# Patient Record
Sex: Male | Born: 1955 | Race: White | Hispanic: No | Marital: Married | Smoking: Never smoker
Health system: Southern US, Community
[De-identification: ages and names within clinical notes are randomized; demographics above are authoritative.]

## PROBLEM LIST (undated history)

## (undated) DIAGNOSIS — N201 Calculus of ureter: Secondary | ICD-10-CM

## (undated) DIAGNOSIS — Z87442 Personal history of urinary calculi: Secondary | ICD-10-CM

## (undated) DIAGNOSIS — Z973 Presence of spectacles and contact lenses: Secondary | ICD-10-CM

## (undated) HISTORY — PX: EXTRACORPOREAL SHOCK WAVE LITHOTRIPSY: SHX1557

---

## 1999-03-25 ENCOUNTER — Encounter: Payer: Self-pay | Admitting: Family Medicine

## 1999-03-25 ENCOUNTER — Ambulatory Visit (HOSPITAL_COMMUNITY): Admission: RE | Admit: 1999-03-25 | Discharge: 1999-03-25 | Payer: Self-pay | Admitting: Family Medicine

## 1999-03-30 ENCOUNTER — Encounter: Payer: Self-pay | Admitting: Urology

## 1999-03-30 ENCOUNTER — Ambulatory Visit (HOSPITAL_COMMUNITY): Admission: RE | Admit: 1999-03-30 | Discharge: 1999-03-30 | Payer: Self-pay | Admitting: Urology

## 1999-05-08 ENCOUNTER — Encounter: Payer: Self-pay | Admitting: Urology

## 1999-05-08 ENCOUNTER — Ambulatory Visit (HOSPITAL_BASED_OUTPATIENT_CLINIC_OR_DEPARTMENT_OTHER): Admission: RE | Admit: 1999-05-08 | Discharge: 1999-05-08 | Payer: Self-pay | Admitting: Urology

## 1999-05-11 ENCOUNTER — Encounter: Payer: Self-pay | Admitting: Urology

## 1999-05-11 ENCOUNTER — Ambulatory Visit (HOSPITAL_COMMUNITY): Admission: RE | Admit: 1999-05-11 | Discharge: 1999-05-11 | Payer: Self-pay | Admitting: Urology

## 2003-12-10 ENCOUNTER — Ambulatory Visit (HOSPITAL_COMMUNITY): Admission: RE | Admit: 2003-12-10 | Discharge: 2003-12-10 | Payer: Self-pay | Admitting: Family Medicine

## 2005-08-11 ENCOUNTER — Emergency Department (HOSPITAL_COMMUNITY): Admission: EM | Admit: 2005-08-11 | Discharge: 2005-08-11 | Payer: Self-pay | Admitting: Emergency Medicine

## 2005-09-20 ENCOUNTER — Emergency Department (HOSPITAL_COMMUNITY): Admission: EM | Admit: 2005-09-20 | Discharge: 2005-09-20 | Payer: Self-pay | Admitting: Emergency Medicine

## 2005-09-22 ENCOUNTER — Ambulatory Visit (HOSPITAL_BASED_OUTPATIENT_CLINIC_OR_DEPARTMENT_OTHER): Admission: RE | Admit: 2005-09-22 | Discharge: 2005-09-22 | Payer: Self-pay | Admitting: Urology

## 2005-09-22 ENCOUNTER — Ambulatory Visit (HOSPITAL_COMMUNITY): Admission: RE | Admit: 2005-09-22 | Discharge: 2005-09-22 | Payer: Self-pay | Admitting: Urology

## 2005-11-11 ENCOUNTER — Ambulatory Visit (HOSPITAL_COMMUNITY): Admission: RE | Admit: 2005-11-11 | Discharge: 2005-11-11 | Payer: Self-pay | Admitting: Urology

## 2006-02-28 ENCOUNTER — Ambulatory Visit (HOSPITAL_COMMUNITY): Admission: RE | Admit: 2006-02-28 | Discharge: 2006-02-28 | Payer: Self-pay | Admitting: Urology

## 2012-08-16 ENCOUNTER — Ambulatory Visit
Admission: RE | Admit: 2012-08-16 | Discharge: 2012-08-16 | Disposition: A | Discharge status: Home / Self Care | Payer: BC Managed Care – PPO | Source: Ambulatory Visit | Attending: Family Medicine | Admitting: Family Medicine

## 2012-08-16 ENCOUNTER — Other Ambulatory Visit: Payer: Self-pay | Admitting: Family Medicine

## 2012-08-16 DIAGNOSIS — R1030 Lower abdominal pain, unspecified: Secondary | ICD-10-CM

## 2012-08-16 MED ORDER — IOHEXOL 300 MG/ML  SOLN
125.0000 mL | Freq: Once | INTRAMUSCULAR | Status: AC | PRN
  Administered 2012-08-16: 125 mL via INTRAVENOUS

## 2012-08-16 MED ORDER — IOHEXOL 300 MG/ML  SOLN
30.0000 mL | Freq: Once | INTRAMUSCULAR | Status: AC | PRN
  Administered 2012-08-16: 30 mL via ORAL

## 2012-08-17 ENCOUNTER — Encounter (HOSPITAL_COMMUNITY): Payer: Self-pay | Admitting: *Deleted

## 2012-08-17 ENCOUNTER — Other Ambulatory Visit: Payer: Self-pay | Admitting: Urology

## 2012-08-17 NOTE — Pre-Procedure Instructions (Signed)
Asked to bring blue folder the day of the procedure,insurance card,I.D. driver's license,wear comfortable clothing and have a driver for the day. Asked not to take Advil,Motrin,Ibuprofen,Aleve or any NSAIDS, Aspirin, or Toradol for 72 hours prior to procedure,  No vitamins or herbal medications 7 days prior to procedure. Instructed to take laxative per doctor's office instructions and eat a light dinner the evening before procedure.   To arrive at 0645 for lithotripsy procedure.  

## 2012-08-18 NOTE — H&P (Signed)
History of Present Illness   Thomas Stafford presents today as an urgent walk-in. He has been seen primarily in the past by Dr. Aldean Ast. He estimates 5-6 clinical stone events in his life. He has required lithotripsy and he believes that was fairly successful in the past. The patient developed some left-sided inguinal discomfort approximately 7-10 days ago. He was initially seen by his primary care physician and thought possibly to have a prostatitis and put on antibiotics. Because of ongoing discomfort as well as his history, the patient did have CT imaging done yesterday. This revealed approximately a 6 mm distal left ureteral calculus. In addition, the patient has quite a large 13 mm x 13 mm stone in his left renal pelvis. That stone is currently nonobstructing and the hydronephrosis he has appears to be secondary to the distal stone. The patient is currently on antibiotic therapy. He is continuing to have some discomfort. He tells me he has passed stones this large in the past. He has been appropriately started on tamsulosin to help facilitate stone passage.      Past Medical History Problems  1. History of  Abdominal Pain In The Left Lower Belly (LLQ) 789.04 2. History of  Hematuria 599.70 3. History of  Hypercholesterolemia 272.0 4. History of  Nephrolithiasis V13.01 5. History of  Ureteral Stone 592.1  Surgical History Problems  1. History of  Cystoscopy With Ureteroscopy 2. History of  Dental Surgery 3. History of  Lithotripsy  Current Meds 1. Lipitor TABS; Therapy: (Recorded:07Nov2013) to 2. TraMADol HCl 50 MG Oral Tablet; Therapy: 06Nov2013 to  Allergies Medication  1. No Known Drug Allergies  Family History Problems  1. Family history of  Family Health Status - Father's Age 75. Family history of  Family Health Status - Mother's Age 47 3. Son's history of  Family Health Status Number Of Children 1 son (8)  Social History Problems  1. Caffeine Use 3 per day 2. Marital  History - Currently Married 3. 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 History of  Alcohol Use 6. History of  Tobacco Use V15.82  Review of Systems Genitourinary, constitutional, skin, eye, otolaryngeal, hematologic/lymphatic, cardiovascular, pulmonary, endocrine, musculoskeletal, gastrointestinal, neurological and psychiatric system(s) were reviewed and pertinent findings if present are noted.  Genitourinary: urinary frequency, feelings of urinary urgency, nocturia, difficulty starting the urinary stream, weak urinary stream, urinary stream starts and stops, hematuria and erectile dysfunction.  Gastrointestinal: abdominal pain.    Vitals Vital Signs [Data Includes: Last 1 Day]  07Nov2013 09:41AM  BMI Calculated: 25.93 BSA Calculated: 2.18 Height: 6 ft 2 in Weight: 202 lb  Blood Pressure: 142 / 94 Temperature: 98.3 F Heart Rate: 76  Physical Exam Constitutional: Well nourished and well developed . No acute distress.  ENT:. The ears and nose are normal in appearance.  Neck: The appearance of the neck is normal and no neck mass is present.  Pulmonary: No respiratory distress and normal respiratory rhythm and effort.  Abdomen: The abdomen is soft and nontender. No masses are palpated. No CVA tenderness. No hernias are palpable. No hepatosplenomegaly noted.  Genitourinary: Examination of the penis demonstrates no discharge, no masses, no lesions and a normal meatus. The scrotum is without lesions. The right epididymis is palpably normal and non-tender. The left epididymis is palpably normal and non-tender. The right testis is non-tender and without masses. The left testis is non-tender and without masses.  Skin: Normal skin turgor, no visible rash and no visible skin  lesions.  Neuro/Psych:. Mood and affect are appropriate.    Results/Data Urine [Data Includes: Last 1 Day]   07Nov2013  COLOR YELLOW   APPEARANCE CLEAR   SPECIFIC GRAVITY 1.025   pH 6.0    GLUCOSE NEG mg/dL  BILIRUBIN NEG   KETONE NEG mg/dL  BLOOD LARGE   PROTEIN TRACE mg/dL  UROBILINOGEN 0.2 mg/dL  NITRITE NEG   LEUKOCYTE ESTERASE SMALL   SQUAMOUS EPITHELIAL/HPF RARE   WBC 11-20 WBC/hpf  RBC TNTC RBC/hpf  BACTERIA FEW   CRYSTALS NONE SEEN   CASTS NONE SEEN    Assessment Assessed  1. Nephrolithiasis Of The Left Kidney 592.0 2. Ureteral Stone 592.1  Plan Ureteral Stone (592.1)  1. Follow-up Schedule Surgery Office  Follow-up  Requested for: 07Nov2013  Discussion/Summary  A total of 35 minutes were spent in the overall care of the patient today with 25 minutes in direct face to face consultation.   Thomas Stafford has a somewhat complicated situation at this time. He has a 6 mm distal stone. I have reviewed his imaging studies and concur with the radiologist's assessment. This stone potentially has about a 50/50 chance of spontaneously passing. He has had pain and some discomfort now for 7-10 days and is not really interested in waiting a long period of time. He is most interested in lithotripsy. He is told that the success rate for a stone in that location is going to be in the 70-80% range. With ureteroscopy, it is going to be higher, like 98%, but it is a more invasive therapy. The other issue is really what to do with his left kidney stone. It does appear to be fairly dense based on Hounsfield units of 1200. My concern is that lithotripsy by itself would result in a high likelihood of obstructing fragments and potentially poor fragmentation. A percutaneous nephrolithotomy may be required. One option I discussed with his was ureteroscopy with Holmium laser of the distal stone with placement of a stent and then lithotripsy. He, however, is more interested in trying lithotripsy for the distal stone. Obviously, how he handles the larger stone in his kidney will depend on how well the lithotripsy works and what we deem the ultimate fragmentation likelihood. He will continue on  tamsulosin. I will add some hydrocodone to his pain regimen in case it is more uncomfortable for him.

## 2012-08-21 ENCOUNTER — Encounter (HOSPITAL_COMMUNITY): Payer: Self-pay

## 2012-08-21 ENCOUNTER — Ambulatory Visit (HOSPITAL_COMMUNITY): Payer: BC Managed Care – PPO

## 2012-08-21 ENCOUNTER — Ambulatory Visit (HOSPITAL_COMMUNITY)
Admission: RE | Admit: 2012-08-21 | Discharge: 2012-08-21 | Disposition: A | Discharge status: Home / Self Care | Payer: BC Managed Care – PPO | Source: Ambulatory Visit | Attending: Urology | Admitting: Urology

## 2012-08-21 ENCOUNTER — Encounter (HOSPITAL_COMMUNITY)
Admission: RE | Disposition: A | Discharge status: Home / Self Care | Payer: Self-pay | Source: Ambulatory Visit | Attending: Urology

## 2012-08-21 DIAGNOSIS — E7800 Pure hypercholesterolemia, unspecified: Secondary | ICD-10-CM | POA: Insufficient documentation

## 2012-08-21 DIAGNOSIS — N201 Calculus of ureter: Secondary | ICD-10-CM | POA: Insufficient documentation

## 2012-08-21 DIAGNOSIS — Z79899 Other long term (current) drug therapy: Secondary | ICD-10-CM | POA: Insufficient documentation

## 2012-08-21 SURGERY — LITHOTRIPSY, ESWL
Anesthesia: LOCAL | Laterality: Left

## 2012-08-21 MED ORDER — CIPROFLOXACIN IN D5W 400 MG/200ML IV SOLN
400.0000 mg | INTRAVENOUS | Status: AC
  Administered 2012-08-21: 400 mg via INTRAVENOUS
  Filled 2012-08-21: qty 200

## 2012-08-21 MED ORDER — DIPHENHYDRAMINE HCL 25 MG PO CAPS
25.0000 mg | ORAL_CAPSULE | ORAL | Status: AC
  Administered 2012-08-21: 25 mg via ORAL
  Filled 2012-08-21: qty 1

## 2012-08-21 MED ORDER — DEXTROSE-NACL 5-0.45 % IV SOLN
INTRAVENOUS | Status: DC
  Administered 2012-08-21: 08:00:00 via INTRAVENOUS

## 2012-08-21 MED ORDER — DIAZEPAM 5 MG PO TABS
10.0000 mg | ORAL_TABLET | ORAL | Status: AC
  Administered 2012-08-21: 10 mg via ORAL
  Filled 2012-08-21: qty 2

## 2012-08-21 NOTE — Op Note (Signed)
See [facility] OP note scanned into chart. 

## 2012-08-21 NOTE — Interval H&P Note (Signed)
History and Physical Interval Note:  08/21/2012 9:21 AM  Thomas Stafford  has presented today for surgery, with the diagnosis of LEFT URETERAL CALCULI  The various methods of treatment have been discussed with the patient and family. After consideration of risks, benefits and other options for treatment, the patient has consented to  Procedure(s) (LRB) with comments: EXTRACORPOREAL SHOCK WAVE LITHOTRIPSY (ESWL) (Left) as a surgical intervention .  The patient's history has been reviewed, patient examined, no change in status, stable for surgery.  I have reviewed the patient's chart and labs.  Questions were answered to the patient's satisfaction.     [name]

## 2012-09-13 ENCOUNTER — Other Ambulatory Visit: Payer: Self-pay | Admitting: Urology

## 2012-09-14 ENCOUNTER — Ambulatory Visit (HOSPITAL_BASED_OUTPATIENT_CLINIC_OR_DEPARTMENT_OTHER)
Admission: RE | Admit: 2012-09-14 | Discharge: 2012-09-14 | Disposition: A | Discharge status: Home / Self Care | Payer: BC Managed Care – PPO | Source: Ambulatory Visit | Attending: Urology | Admitting: Urology

## 2012-09-14 ENCOUNTER — Other Ambulatory Visit: Payer: Self-pay | Admitting: Urology

## 2012-09-14 ENCOUNTER — Encounter (HOSPITAL_BASED_OUTPATIENT_CLINIC_OR_DEPARTMENT_OTHER): Payer: Self-pay | Admitting: Anesthesiology

## 2012-09-14 ENCOUNTER — Ambulatory Visit (HOSPITAL_BASED_OUTPATIENT_CLINIC_OR_DEPARTMENT_OTHER): Payer: BC Managed Care – PPO | Admitting: Anesthesiology

## 2012-09-14 ENCOUNTER — Encounter (HOSPITAL_BASED_OUTPATIENT_CLINIC_OR_DEPARTMENT_OTHER)
Admission: RE | Disposition: A | Discharge status: Home / Self Care | Payer: Self-pay | Source: Ambulatory Visit | Attending: Urology

## 2012-09-14 ENCOUNTER — Encounter (HOSPITAL_BASED_OUTPATIENT_CLINIC_OR_DEPARTMENT_OTHER): Payer: Self-pay | Admitting: *Deleted

## 2012-09-14 DIAGNOSIS — N200 Calculus of kidney: Secondary | ICD-10-CM | POA: Insufficient documentation

## 2012-09-14 DIAGNOSIS — R109 Unspecified abdominal pain: Secondary | ICD-10-CM | POA: Insufficient documentation

## 2012-09-14 HISTORY — DX: Calculus of ureter: N20.1

## 2012-09-14 HISTORY — PX: CYSTOSCOPY W/ URETERAL STENT PLACEMENT: SHX1429

## 2012-09-14 LAB — POCT HEMOGLOBIN-HEMACUE: Hemoglobin: 16.3 g/dL (ref 13.0–17.0)

## 2012-09-14 SURGERY — CYSTOSCOPY, WITH RETROGRADE PYELOGRAM AND URETERAL STENT INSERTION
Anesthesia: General | Site: Ureter | Laterality: Left | Wound class: Clean Contaminated

## 2012-09-14 MED ORDER — IOHEXOL 350 MG/ML SOLN
INTRAVENOUS | Status: DC | PRN
  Administered 2012-09-14: 4 mL

## 2012-09-14 MED ORDER — STERILE WATER FOR IRRIGATION IR SOLN
Status: DC | PRN
  Administered 2012-09-14: 3000 mL

## 2012-09-14 MED ORDER — PROPOFOL 10 MG/ML IV BOLUS
INTRAVENOUS | Status: DC | PRN
  Administered 2012-09-14: 200 mg via INTRAVENOUS

## 2012-09-14 MED ORDER — DIAZEPAM 5 MG PO TABS
10.0000 mg | ORAL_TABLET | ORAL | Status: DC
  Filled 2012-09-14: qty 2

## 2012-09-14 MED ORDER — TAMSULOSIN HCL 0.4 MG PO CAPS
0.4000 mg | ORAL_CAPSULE | Freq: Once | ORAL | Status: AC
  Administered 2012-09-14: 0.4 mg via ORAL
  Filled 2012-09-14: qty 1

## 2012-09-14 MED ORDER — LIDOCAINE HCL (CARDIAC) 20 MG/ML IV SOLN
INTRAVENOUS | Status: DC | PRN
  Administered 2012-09-14: 60 mg via INTRAVENOUS

## 2012-09-14 MED ORDER — ONDANSETRON HCL 4 MG/2ML IJ SOLN
INTRAMUSCULAR | Status: DC | PRN
  Administered 2012-09-14: 4 mg via INTRAVENOUS

## 2012-09-14 MED ORDER — MIDAZOLAM HCL 5 MG/5ML IJ SOLN
INTRAMUSCULAR | Status: DC | PRN
  Administered 2012-09-14: 2 mg via INTRAVENOUS

## 2012-09-14 MED ORDER — BELLADONNA ALKALOIDS-OPIUM 16.2-60 MG RE SUPP
1.0000 | Freq: Once | RECTAL | Status: AC
  Administered 2012-09-14: 1 via RECTAL
  Filled 2012-09-14: qty 1

## 2012-09-14 MED ORDER — LACTATED RINGERS IV SOLN
INTRAVENOUS | Status: DC
  Administered 2012-09-14 (×2): via INTRAVENOUS
  Filled 2012-09-14: qty 1000

## 2012-09-14 MED ORDER — LACTATED RINGERS IV SOLN
INTRAVENOUS | Status: DC
  Filled 2012-09-14: qty 1000

## 2012-09-14 MED ORDER — CIPROFLOXACIN IN D5W 400 MG/200ML IV SOLN
400.0000 mg | INTRAVENOUS | Status: DC
  Filled 2012-09-14: qty 200

## 2012-09-14 MED ORDER — LIDOCAINE HCL 2 % EX GEL
CUTANEOUS | Status: DC | PRN
  Administered 2012-09-14: 1 via URETHRAL

## 2012-09-14 MED ORDER — FENTANYL CITRATE 0.05 MG/ML IJ SOLN
25.0000 ug | INTRAMUSCULAR | Status: DC | PRN
  Filled 2012-09-14: qty 1

## 2012-09-14 MED ORDER — DEXAMETHASONE SODIUM PHOSPHATE 4 MG/ML IJ SOLN
INTRAMUSCULAR | Status: DC | PRN
  Administered 2012-09-14: 8 mg via INTRAVENOUS

## 2012-09-14 MED ORDER — FENTANYL CITRATE 0.05 MG/ML IJ SOLN
INTRAMUSCULAR | Status: DC | PRN
  Administered 2012-09-14 (×2): 50 ug via INTRAVENOUS

## 2012-09-14 MED ORDER — DEXTROSE-NACL 5-0.45 % IV SOLN
INTRAVENOUS | Status: DC
  Filled 2012-09-14: qty 1000

## 2012-09-14 MED ORDER — DIPHENHYDRAMINE HCL 25 MG PO CAPS
25.0000 mg | ORAL_CAPSULE | ORAL | Status: DC
  Filled 2012-09-14: qty 1

## 2012-09-14 SURGICAL SUPPLY — 19 items
BAG DRAIN URO-CYSTO SKYTR STRL (DRAIN) ×2 IMPLANT
CANISTER SUCT LVC 12 LTR MEDI- (MISCELLANEOUS) ×2 IMPLANT
CATH INTERMIT  6FR 70CM (CATHETERS) IMPLANT
CATH URET 5FR 28IN CONE TIP (BALLOONS)
CATH URET 5FR 70CM CONE TIP (BALLOONS) IMPLANT
CLOTH BEACON ORANGE TIMEOUT ST (SAFETY) ×2 IMPLANT
DRAPE CAMERA CLOSED 9X96 (DRAPES) ×2 IMPLANT
GLOVE BIO SURGEON STRL SZ7 (GLOVE) ×2 IMPLANT
GLOVE BIOGEL PI IND STRL 6.5 (GLOVE) ×1 IMPLANT
GLOVE BIOGEL PI IND STRL 7.0 (GLOVE) ×1 IMPLANT
GLOVE BIOGEL PI INDICATOR 6.5 (GLOVE) ×1
GLOVE BIOGEL PI INDICATOR 7.0 (GLOVE) ×1
GOWN STRL NON-REIN LRG LVL3 (GOWN DISPOSABLE) ×4 IMPLANT
GUIDEWIRE 0.038 PTFE COATED (WIRE) IMPLANT
GUIDEWIRE ANG ZIPWIRE 038X150 (WIRE) IMPLANT
GUIDEWIRE STR DUAL SENSOR (WIRE) IMPLANT
NS IRRIG 500ML POUR BTL (IV SOLUTION) IMPLANT
PACK CYSTOSCOPY (CUSTOM PROCEDURE TRAY) ×2 IMPLANT
STENT URET 6FRX26 CONTOUR (STENTS) ×2 IMPLANT

## 2012-09-14 NOTE — Progress Notes (Signed)
Pt requested stronger pain med.  Dr. Brunilda Payor paged and informed.  Wife sent to [facility] to pick up new script.

## 2012-09-14 NOTE — Op Note (Signed)
   Thomas Stafford 09/14/2012, 3:10 PM      Thomas Stafford is a 56 y.o.   09/14/2012  General  Pre-op diagnosis: Left renal pelvis calculus. Severe left flank pain  Postop diagnosis: Same  Procedure done: Cystoscopy, left retrograde pyelogram, insertion of double-J stent  Surgeon: Wendie Simmer. Selenia Mihok  Anesthesia: General  Indication: Patient is a 56 years old male who had ESL of a left and distal ureteral calculus about 2 weeks ago. He passed several small stone fragments. KUB 2 days ago showed no remaining stone in the distal ureter. He also is known to have a 10 mm stone in the left kidney. He was doing well until yesterday afternoon when he experienced severe left flank pain associated with nausea and vomiting. The pain persisted throughout the night. He was seen in the office this morning. Repeat KUB shows that the stone has migrated into the proximal ureter. He is already scheduled for ESL of the left renal calculus. He is scheduled now for cystoscopy, left retrograde pyelogram and insertion of double-J stent.  Procedure: Patient was identified by his wrist band and proper timeout was taken.  Under general anesthesia patient was prepped and draped and placed in the dorsolithotomy position. A panendoscope was inserted in the bladder. The anterior urethra is normal. There is moderate prostatic hypertrophy. There is no stone or tumor in the bladder. The ureteral orifices are in normal position and shape.  Retrograde pyelogram:  A cone-tip catheter was passed through the cystoscope and the left ureteral orifice. Contrast was then injected through the cone-tip catheter. The distal and mid ureter appear normal. There is a large filling defect at the UPJ. I did not inject the contrast under pressure and the collecting system was not clearly visualized. The cone-tip catheter was then removed.  A sensor wire was passed through the cystoscope and the left ureter. A #[id number] double-J stent was  passed over the sensor wire. The sensor wire was then removed. The proximal curl of the double-J stent is in the collecting system and the distal curl is in the bladder. The stent was left without a string.  The bladder was then emptied and the cystoscope removed.  Patient tolerated the procedure well and left the OR in satisfactory condition to postanesthesia care unit. The from

## 2012-09-14 NOTE — Anesthesia Postprocedure Evaluation (Signed)
  Anesthesia Post-op Note  Patient: Thomas Stafford  Procedure(s) Performed: Procedure(s) (LRB): CYSTOSCOPY WITH RETROGRADE PYELOGRAM/URETERAL STENT PLACEMENT (Left)  Patient Location: PACU  Anesthesia Type: General  Level of Consciousness: awake and alert   Airway and Oxygen Therapy: Patient Spontanous Breathing  Post-op Pain: mild  Post-op Assessment: Post-op Vital signs reviewed, Patient's Cardiovascular Status Stable, Respiratory Function Stable, Patent Airway and No signs of Nausea or vomiting  Last Vitals:  Filed Vitals:   09/14/12 1517  BP: 130/85  Pulse: 75  Temp: 36.2 C  Resp: 14    Post-op Vital Signs: stable   Complications: No apparent anesthesia complications

## 2012-09-14 NOTE — H&P (Signed)
History of Present Illness  [name] is a patient of Thomas Stafford who had ESL of a left distal ureteral calculus on 11/11.  He passed some small stone fragments and was doing well until yesterday afternoon at about 5 PM when he experienced severe left flank and left lower quadrant pain associated with nausea and vomiting.  The pain persisted throught the night.  He is known to have a 10 mm left renal pelvis stone and is scheduled for ESL on 12/16.  KUB today shows that the stone may have migrated to the UPJ.  There is no opaque stone in the proximal, mid or distal ureter.   Past Medical History Problems  1. History of  Abdominal Pain In The Left Lower Belly (LLQ) 789.04 2. History of  Hematuria 599.70 3. History of  Hypercholesterolemia 272.0 4. History of  Nephrolithiasis V13.01 5. History of  Ureteral Stone 592.1  Surgical History Problems  1. History of  Cystoscopy With Ureteroscopy 2. History of  Dental Surgery 3. History of  Lithotripsy  Current Meds 1. Hydrocodone-Acetaminophen 5-500 MG Oral Tablet; 1-2 TABS PO Q 4-6H PRN PAIN; Therapy:  07Nov2013 to (Last Rx:07Nov2013) 2. Lipitor TABS; Therapy: (Recorded:07Nov2013) to 3. TraMADol HCl 50 MG Oral Tablet; Therapy: 06Nov2013 to  Allergies Medication  1. No Known Drug Allergies  Family History Problems  1. Family history of  Family Health Status - Father's Age 52. Family history of  Family Health Status - Mother's Age 42 3. Son's history of  Family Health Status Number Of Children 1 son (8)  Social History Problems  1. Caffeine Use 3 per day 2. Marital History - Currently Married 3. 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 History of  Alcohol Use 6. History of  Tobacco Use V15.82  Review of Systems Genitourinary, constitutional, skin, eye, otolaryngeal, hematologic/lymphatic, cardiovascular, pulmonary, endocrine, musculoskeletal, gastrointestinal, neurological and psychiatric system(s)  were reviewed and pertinent findings if present are noted.  Gastrointestinal: nausea, vomiting and abdominal pain.    Vitals Vital Signs [Data Includes: Last 1 Day]  05Dec2013 10:54AM  Blood Pressure: 135 / 91 Temperature: 97.5 F Heart Rate: 82 Respiration: 18  Physical Exam Constitutional: Well nourished and well developed . No acute distress.  ENT:. The ears and nose are normal in appearance.  Neck: The appearance of the neck is normal and no neck mass is present.  Pulmonary: No respiratory distress and normal respiratory rhythm and effort.  Cardiovascular: Heart rate and rhythm are normal . No peripheral edema.  Abdomen: The abdomen is soft and nontender. No masses are palpated. No CVA tenderness. No hernias are palpable. No hepatosplenomegaly noted.  Genitourinary: Examination of the penis demonstrates no discharge, no masses, no lesions and a normal meatus. The scrotum is without lesions. The right epididymis is palpably normal and non-tender. The left epididymis is palpably normal and non-tender. The right testis is non-tender and without masses. The left testis is non-tender and without masses.  Lymphatics: The femoral and inguinal nodes are not enlarged or tender.  Skin: Normal skin turgor, no visible rash and no visible skin lesions.  Neuro/Psych:. Mood and affect are appropriate.    Results/Data Urine [Data Includes: Last 1 Day]   05Dec2013  COLOR YELLOW   APPEARANCE CLEAR   SPECIFIC GRAVITY 1.020   pH 7.0   GLUCOSE NEG mg/dL  BILIRUBIN NEG   KETONE NEG mg/dL  BLOOD LARGE   PROTEIN TRACE mg/dL  UROBILINOGEN 0.2 mg/dL  NITRITE NEG  LEUKOCYTE ESTERASE NEG   SQUAMOUS EPITHELIAL/HPF RARE   WBC 0-2 WBC/hpf  RBC TNTC RBC/hpf  BACTERIA FEW   CRYSTALS NONE SEEN   CASTS NONE SEEN   Other SEE NOTE     KUB INDICATION: Left flank pain.  Left renal calculus KUB shows normal bowel gas pattern.  Psoas shadows are normal.  There are several calcifications in the lower pole  of the left kidney.  A 10 mm calcification appears to have migrated from the mid/upper pole of the left kidney to the UPJ/proximal ureter.  There are also some smaller stones adjacent to the larger calculus.  I do not see any opaque stones in the mid/distal ureter.  There are no opaque calcifications in the right kidney nor in the course of the right ureter. IMPRESSION: Left UPJ calculus.  Left renal calculi.   Assessment Assessed  1. Nephrolithiasis Of The Left Kidney 592.0 2. Proximal Ureteral Stone On The Left 592.1  Plan Health Maintenance (V70.0)  1. UA With REFLEX  Done: 05Dec2013 10:42AM Nephrolithiasis Of The Left Kidney (592.0)  2. KUB  Done: 05Dec2013 12:00AM   His pain is probably secondary to the UPJ stone.  He would benefit from stent placement.  He will follow-up with Thomas Stafford for ESL as planned.   Signatures Electronically signed by : Thomas Stafford, M.D.; Sep 14 2012  1:20PM

## 2012-09-14 NOTE — Anesthesia Procedure Notes (Signed)
Procedure Name: LMA Insertion Date/Time: 09/14/2012 2:52 PM Performed by: Renella Cunas D Pre-anesthesia Checklist: Patient identified, Emergency Drugs available, Suction available and Patient being monitored Patient Re-evaluated:Patient Re-evaluated prior to inductionOxygen Delivery Method: Circle System Utilized Preoxygenation: Pre-oxygenation with 100% oxygen Intubation Type: IV induction Ventilation: Mask ventilation without difficulty LMA: LMA inserted LMA Size: 4.0 Number of attempts: 1 Airway Equipment and Method: bite block Placement Confirmation: positive ETCO2 Tube secured with: Tape Dental Injury: Teeth and Oropharynx as per pre-operative assessment

## 2012-09-14 NOTE — Transfer of Care (Signed)
Immediate Anesthesia Transfer of Care Note  Patient: Thomas Stafford  Procedure(s) Performed: Procedure(s) (LRB): CYSTOSCOPY WITH RETROGRADE PYELOGRAM/URETERAL STENT PLACEMENT (Left)  Patient Location: PACU  Anesthesia Type: General  Level of Consciousness: awake, oriented, sedated and patient cooperative  Airway & Oxygen Therapy: Patient Spontanous Breathing and Patient connected to face mask oxygen  Post-op Assessment: Report given to PACU RN and Post -op Vital signs reviewed and stable  Post vital signs: Reviewed and stable  Complications: No apparent anesthesia complications

## 2012-09-14 NOTE — Progress Notes (Signed)
Spoke w/ D. Myrtie Soman, NP.  Informed of void x2 30cc's,75cc's in that order. Pt had to strain to void the 2nd attempt. Bladder scanned , only 41cc's noted.  abd soft. Pt expressed desire to go home.  Ok for d/c to home.  Pt instructed if he is unable to void, to go to nearest E.R.  Pt verbalized his understanding.  See orders.

## 2012-09-14 NOTE — Progress Notes (Signed)
Still waiting for return call from Dr. On call.  Cherlynn Polo, NP paged via answering service.

## 2012-09-14 NOTE — Anesthesia Preprocedure Evaluation (Signed)

## 2012-09-15 ENCOUNTER — Encounter (HOSPITAL_BASED_OUTPATIENT_CLINIC_OR_DEPARTMENT_OTHER): Payer: Self-pay | Admitting: Urology

## 2012-09-15 NOTE — Pre-Procedure Instructions (Signed)
Asked to bring blue folder the day of the procedure,insurance card,I.D. driver's license,wear comfortable clothing and have a driver for the day. Asked not to take Advil,Motrin,Ibuprofen,Aleve or any NSAIDS, Aspirin, or Toradol for 72 hours prior to procedure,  No vitamins or herbal medications 7 days prior to procedure. Instructed to take laxative per doctor's office instructions and eat a light dinner the evening before procedure.   To arrive at 1000 for lithotripsy procedure.

## 2012-09-20 ENCOUNTER — Encounter (HOSPITAL_COMMUNITY): Payer: Self-pay | Admitting: Pharmacy Technician

## 2012-09-22 NOTE — Progress Notes (Signed)
Patient is posted for ESWL on 09/25/12; however, orders entered as if pt is having surgery. Message left with Webb Silversmith, scheduler at [facility] to clarify orders for Dr Isabel Caprice.

## 2012-09-25 ENCOUNTER — Ambulatory Visit (HOSPITAL_COMMUNITY): Payer: BC Managed Care – PPO

## 2012-09-25 ENCOUNTER — Encounter (HOSPITAL_COMMUNITY): Payer: Self-pay | Admitting: *Deleted

## 2012-09-25 ENCOUNTER — Ambulatory Visit (HOSPITAL_COMMUNITY)
Admission: RE | Admit: 2012-09-25 | Discharge: 2012-09-25 | Disposition: A | Discharge status: Home / Self Care | Payer: BC Managed Care – PPO | Source: Ambulatory Visit | Attending: Urology | Admitting: Urology

## 2012-09-25 ENCOUNTER — Encounter (HOSPITAL_COMMUNITY)
Admission: RE | Disposition: A | Discharge status: Home / Self Care | Payer: Self-pay | Source: Ambulatory Visit | Attending: Urology

## 2012-09-25 DIAGNOSIS — N201 Calculus of ureter: Secondary | ICD-10-CM | POA: Insufficient documentation

## 2012-09-25 DIAGNOSIS — N200 Calculus of kidney: Secondary | ICD-10-CM | POA: Insufficient documentation

## 2012-09-25 DIAGNOSIS — E7800 Pure hypercholesterolemia, unspecified: Secondary | ICD-10-CM | POA: Insufficient documentation

## 2012-09-25 DIAGNOSIS — R319 Hematuria, unspecified: Secondary | ICD-10-CM | POA: Insufficient documentation

## 2012-09-25 DIAGNOSIS — Z79899 Other long term (current) drug therapy: Secondary | ICD-10-CM | POA: Insufficient documentation

## 2012-09-25 SURGERY — LITHOTRIPSY, ESWL
Anesthesia: LOCAL | Laterality: Left

## 2012-09-25 MED ORDER — DIAZEPAM 5 MG PO TABS
10.0000 mg | ORAL_TABLET | Freq: Once | ORAL | Status: AC
  Administered 2012-09-25: 10 mg via ORAL
  Filled 2012-09-25: qty 2

## 2012-09-25 MED ORDER — DIPHENHYDRAMINE HCL 25 MG PO CAPS
25.0000 mg | ORAL_CAPSULE | Freq: Once | ORAL | Status: AC
  Administered 2012-09-25: 25 mg via ORAL
  Filled 2012-09-25 (×2): qty 1

## 2012-09-25 MED ORDER — DEXTROSE-NACL 5-0.45 % IV SOLN
INTRAVENOUS | Status: DC
  Administered 2012-09-25: 11:00:00 via INTRAVENOUS

## 2012-09-25 MED ORDER — DIPHENHYDRAMINE HCL 25 MG PO TABS: 25.0000 mg | ORAL_TABLET | Freq: Once | ORAL | Status: DC

## 2012-09-25 MED ORDER — CIPROFLOXACIN HCL 500 MG PO TABS
500.0000 mg | ORAL_TABLET | Freq: Once | ORAL | Status: AC
  Administered 2012-09-25: 500 mg via ORAL
  Filled 2012-09-25: qty 1

## 2012-09-25 NOTE — H&P (View-Only) (Signed)
History of Present Illness  Thomas Stafford is a patient of Dr Grapey's who had ESL of a left distal ureteral calculus on 11/11.  He passed some small stone fragments and was doing well until yesterday afternoon at about 5 PM when he experienced severe left flank and left lower quadrant pain associated with nausea and vomiting.  The pain persisted throught the night.  He is known to have a 10 mm left renal pelvis stone and is scheduled for ESL on 12/16.  KUB today shows that the stone may have migrated to the UPJ.  There is no opaque stone in the proximal, mid or distal ureter.   Past Medical History Problems  1. History of  Abdominal Pain In The Left Lower Belly (LLQ) 789.04 2. History of  Hematuria 599.70 3. History of  Hypercholesterolemia 272.0 4. History of  Nephrolithiasis V13.01 5. History of  Ureteral Stone 592.1  Surgical History Problems  1. History of  Cystoscopy With Ureteroscopy 2. History of  Dental Surgery 3. History of  Lithotripsy  Current Meds 1. Hydrocodone-Acetaminophen 5-500 MG Oral Tablet; 1-2 TABS PO Q 4-6H PRN PAIN; Therapy:  07Nov2013 to (Last Rx:07Nov2013) 2. Lipitor TABS; Therapy: (Recorded:07Nov2013) to 3. TraMADol HCl 50 MG Oral Tablet; Therapy: 06Nov2013 to  Allergies Medication  1. No Known Drug Allergies  Family History Problems  1. Family history of  Family Health Status - Father's Age 2. Family history of  Family Health Status - Mother's Age 76 3. Son's history of  Family Health Status Number Of Children 1 son (8)  Social History Problems  1. Caffeine Use 3 per day 2. Marital History - Currently Married 3. 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 History of  Alcohol Use 6. History of  Tobacco Use V15.82  Review of Systems Genitourinary, constitutional, skin, eye, otolaryngeal, hematologic/lymphatic, cardiovascular, pulmonary, endocrine, musculoskeletal, gastrointestinal, neurological and psychiatric system(s)  were reviewed and pertinent findings if present are noted.  Gastrointestinal: nausea, vomiting and abdominal pain.    Vitals Vital Signs [Data Includes: Last 1 Day]  05Dec2013 10:54AM  Blood Pressure: 135 / 91 Temperature: 97.5 F Heart Rate: 82 Respiration: 18  Physical Exam Constitutional: Well nourished and well developed . No acute distress.  ENT:. The ears and nose are normal in appearance.  Neck: The appearance of the neck is normal and no neck mass is present.  Pulmonary: No respiratory distress and normal respiratory rhythm and effort.  Cardiovascular: Heart rate and rhythm are normal . No peripheral edema.  Abdomen: The abdomen is soft and nontender. No masses are palpated. No CVA tenderness. No hernias are palpable. No hepatosplenomegaly noted.  Genitourinary: Examination of the penis demonstrates no discharge, no masses, no lesions and a normal meatus. The scrotum is without lesions. The right epididymis is palpably normal and non-tender. The left epididymis is palpably normal and non-tender. The right testis is non-tender and without masses. The left testis is non-tender and without masses.  Lymphatics: The femoral and inguinal nodes are not enlarged or tender.  Skin: Normal skin turgor, no visible rash and no visible skin lesions.  Neuro/Psych:. Mood and affect are appropriate.    Results/Data Urine [Data Includes: Last 1 Day]   05Dec2013  COLOR YELLOW   APPEARANCE CLEAR   SPECIFIC GRAVITY 1.020   pH 7.0   GLUCOSE NEG mg/dL  BILIRUBIN NEG   KETONE NEG mg/dL  BLOOD LARGE   PROTEIN TRACE mg/dL  UROBILINOGEN 0.2 mg/dL  NITRITE NEG     LEUKOCYTE ESTERASE NEG   SQUAMOUS EPITHELIAL/HPF RARE   WBC 0-2 WBC/hpf  RBC TNTC RBC/hpf  BACTERIA FEW   CRYSTALS NONE SEEN   CASTS NONE SEEN   Other SEE NOTE     KUB INDICATION: Left flank pain.  Left renal calculus KUB shows normal bowel gas pattern.  Psoas shadows are normal.  There are several calcifications in the lower pole  of the left kidney.  A 10 mm calcification appears to have migrated from the mid/upper pole of the left kidney to the UPJ/proximal ureter.  There are also some smaller stones adjacent to the larger calculus.  I do not see any opaque stones in the mid/distal ureter.  There are no opaque calcifications in the right kidney nor in the course of the right ureter. IMPRESSION: Left UPJ calculus.  Left renal calculi.   Assessment Assessed  1. Nephrolithiasis Of The Left Kidney 592.0 2. Proximal Ureteral Stone On The Left 592.1  Plan Health Maintenance (V70.0)  1. UA With REFLEX  Done: 05Dec2013 10:42AM Nephrolithiasis Of The Left Kidney (592.0)  2. KUB  Done: 05Dec2013 12:00AM   His pain is probably secondary to the UPJ stone.  He would benefit from stent placement.  He will follow-up with Dr Grapey for ESL as planned.   Signatures Electronically signed by : Jamaris Theard, M.D.; Sep 14 2012  1:20PM   

## 2012-09-25 NOTE — Interval H&P Note (Signed)
History and Physical Interval Note:  09/25/2012 10:42 AM  Thomas Stafford  has presented today for surgery, with the diagnosis of Left Renal Calculus  The various methods of treatment have been discussed with the patient and family. After consideration of risks, benefits and other options for treatment, the patient has consented to  Procedure(s) (LRB) with comments: EXTRACORPOREAL SHOCK WAVE LITHOTRIPSY (ESWL) (Left) as a surgical intervention .  The patient's history has been reviewed, patient examined, no change in status, stable for surgery.  I have reviewed the patient's chart and labs.  Questions were answered to the patient's satisfaction.     [name] S  Patient now for definitive ESWL for left renal pelvis stone s/p JJ stent placement by Dr. Su Grand

## 2012-09-25 NOTE — Op Note (Signed)
See [facility] OP note scanned into chart. 

## 2013-09-01 IMAGING — CR DG ABDOMEN 1V
1 series · 1 of 1 positions shown · non-contrast
Comparison: 09/14/2012.

CLINICAL DATA: Pre lithotripsy.  Left-sided kidney stone.

ABDOMEN - 1 VIEW

[t abdomen supine]
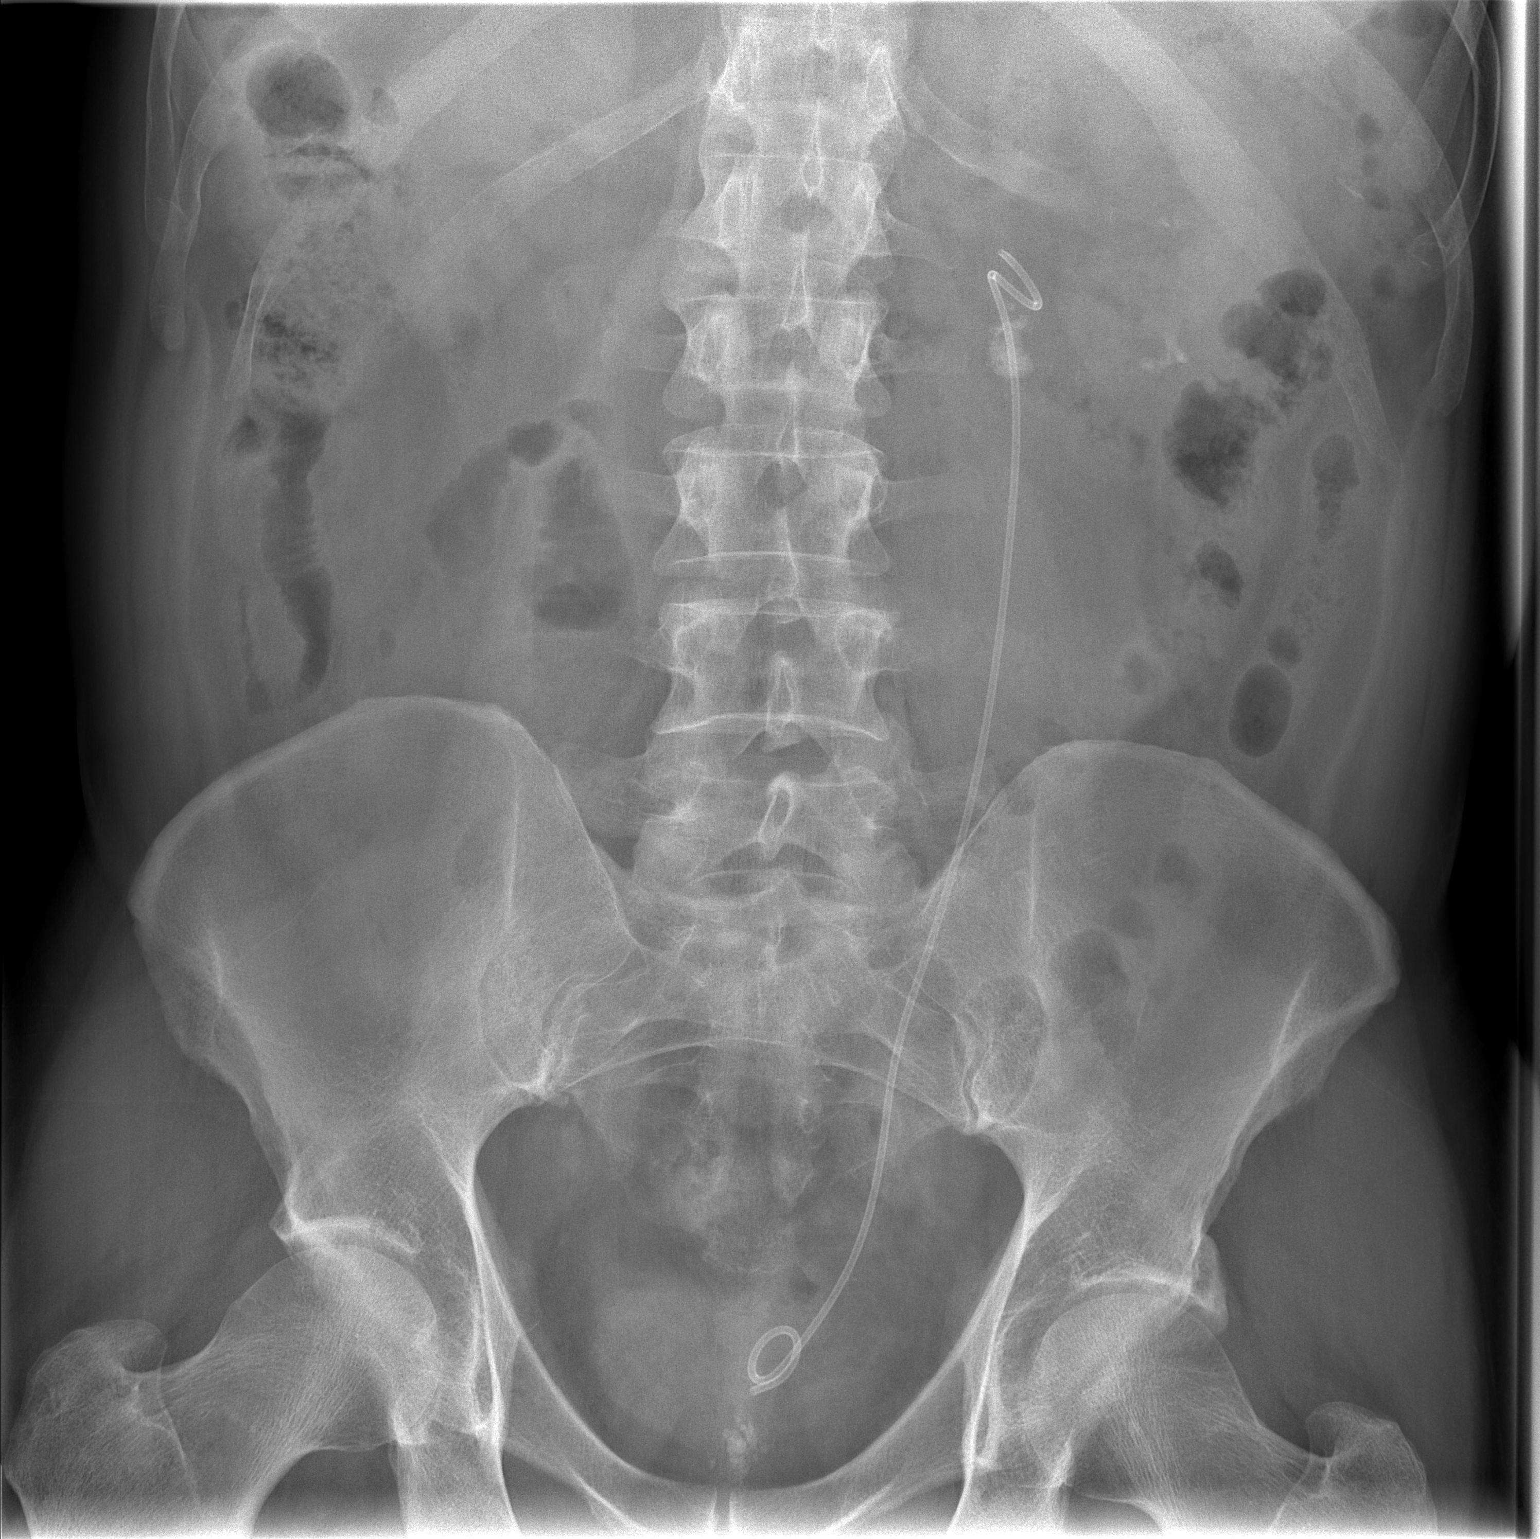

[1 of 1 positions shown; findings below may reference images not displayed]

FINDINGS: Compared to the prior examination there has been interval
placement of a left-sided double J ureteral stents, with
approximately reformed in the expected location of the left renal
pelvis and the distal loop performed in the expected location of
the urinary bladder.  Again noted are multiple calculi projecting
over the left renal collecting system, with the largest calculus
measuring approximately 18 mm projecting adjacent to the proximal
aspect of the left stent.  Calcifications in the low pelvis are
again noted, presumably prostatic.
IMPRESSION: 1.  Multiple left-sided calculi, as above, largest of which
measures up to 18 mm in diameter projecting adjacent to the
proximal aspect of the left double-J ureteral stent.

## 2013-12-07 ENCOUNTER — Encounter (HOSPITAL_BASED_OUTPATIENT_CLINIC_OR_DEPARTMENT_OTHER): Payer: Self-pay | Admitting: *Deleted

## 2013-12-07 ENCOUNTER — Other Ambulatory Visit: Payer: Self-pay | Admitting: Urology

## 2013-12-07 NOTE — Progress Notes (Signed)
NPO AFTER MN. ARRIVE AT 0800. NEEDS HG. MAY TAKE HYDROCODONE IF NEEDED W/ SIPS OF WATER.

## 2013-12-10 ENCOUNTER — Ambulatory Visit (HOSPITAL_BASED_OUTPATIENT_CLINIC_OR_DEPARTMENT_OTHER): Admission: RE | Admit: 2013-12-10 | Payer: BC Managed Care – PPO | Source: Ambulatory Visit | Admitting: Urology

## 2013-12-10 HISTORY — DX: Presence of spectacles and contact lenses: Z97.3

## 2013-12-10 HISTORY — DX: Personal history of urinary calculi: Z87.442

## 2013-12-10 SURGERY — CYSTOURETEROSCOPY, WITH STENT INSERTION
Anesthesia: General | Laterality: Left

## 2016-08-16 DIAGNOSIS — I10 Essential (primary) hypertension: Secondary | ICD-10-CM | POA: Diagnosis not present

## 2016-08-16 DIAGNOSIS — E782 Mixed hyperlipidemia: Secondary | ICD-10-CM | POA: Diagnosis not present

## 2016-08-16 DIAGNOSIS — M109 Gout, unspecified: Secondary | ICD-10-CM | POA: Diagnosis not present

## 2016-10-18 DIAGNOSIS — E782 Mixed hyperlipidemia: Secondary | ICD-10-CM | POA: Diagnosis not present

## 2016-10-18 DIAGNOSIS — M109 Gout, unspecified: Secondary | ICD-10-CM | POA: Diagnosis not present

## 2016-10-18 DIAGNOSIS — I10 Essential (primary) hypertension: Secondary | ICD-10-CM | POA: Diagnosis not present

## 2016-11-18 DIAGNOSIS — M109 Gout, unspecified: Secondary | ICD-10-CM | POA: Diagnosis not present

## 2016-11-18 DIAGNOSIS — I10 Essential (primary) hypertension: Secondary | ICD-10-CM | POA: Diagnosis not present

## 2016-11-18 DIAGNOSIS — E782 Mixed hyperlipidemia: Secondary | ICD-10-CM | POA: Diagnosis not present

## 2016-12-07 DIAGNOSIS — R945 Abnormal results of liver function studies: Secondary | ICD-10-CM | POA: Diagnosis not present

## 2016-12-17 DIAGNOSIS — E785 Hyperlipidemia, unspecified: Secondary | ICD-10-CM | POA: Diagnosis not present

## 2017-02-10 DIAGNOSIS — J069 Acute upper respiratory infection, unspecified: Secondary | ICD-10-CM | POA: Diagnosis not present

## 2017-02-10 DIAGNOSIS — B356 Tinea cruris: Secondary | ICD-10-CM | POA: Diagnosis not present

## 2017-02-15 DIAGNOSIS — B3789 Other sites of candidiasis: Secondary | ICD-10-CM | POA: Diagnosis not present

## 2017-04-25 DIAGNOSIS — L309 Dermatitis, unspecified: Secondary | ICD-10-CM | POA: Diagnosis not present

## 2017-05-11 DIAGNOSIS — L409 Psoriasis, unspecified: Secondary | ICD-10-CM | POA: Diagnosis not present

## 2017-05-11 DIAGNOSIS — D485 Neoplasm of uncertain behavior of skin: Secondary | ICD-10-CM | POA: Diagnosis not present

## 2017-06-30 DIAGNOSIS — E785 Hyperlipidemia, unspecified: Secondary | ICD-10-CM | POA: Diagnosis not present

## 2017-06-30 DIAGNOSIS — I10 Essential (primary) hypertension: Secondary | ICD-10-CM | POA: Diagnosis not present

## 2017-06-30 DIAGNOSIS — M109 Gout, unspecified: Secondary | ICD-10-CM | POA: Diagnosis not present

## 2017-06-30 DIAGNOSIS — Z23 Encounter for immunization: Secondary | ICD-10-CM | POA: Diagnosis not present

## 2017-08-02 DIAGNOSIS — Z0000 Encounter for general adult medical examination without abnormal findings: Secondary | ICD-10-CM | POA: Diagnosis not present

## 2017-08-02 DIAGNOSIS — Z125 Encounter for screening for malignant neoplasm of prostate: Secondary | ICD-10-CM | POA: Diagnosis not present

## 2017-10-18 DIAGNOSIS — D123 Benign neoplasm of transverse colon: Secondary | ICD-10-CM | POA: Diagnosis not present

## 2017-10-18 DIAGNOSIS — Z86010 Personal history of colonic polyps: Secondary | ICD-10-CM | POA: Diagnosis not present

## 2017-12-12 DIAGNOSIS — H40013 Open angle with borderline findings, low risk, bilateral: Secondary | ICD-10-CM | POA: Diagnosis not present

## 2018-01-16 DIAGNOSIS — H401231 Low-tension glaucoma, bilateral, mild stage: Secondary | ICD-10-CM | POA: Diagnosis not present

## 2018-02-01 DIAGNOSIS — M109 Gout, unspecified: Secondary | ICD-10-CM | POA: Diagnosis not present

## 2018-02-01 DIAGNOSIS — I10 Essential (primary) hypertension: Secondary | ICD-10-CM | POA: Diagnosis not present

## 2018-02-01 DIAGNOSIS — E785 Hyperlipidemia, unspecified: Secondary | ICD-10-CM | POA: Diagnosis not present

## 2018-02-15 DIAGNOSIS — H401231 Low-tension glaucoma, bilateral, mild stage: Secondary | ICD-10-CM | POA: Diagnosis not present

## 2018-02-22 DIAGNOSIS — H18832 Recurrent erosion of cornea, left eye: Secondary | ICD-10-CM | POA: Diagnosis not present

## 2018-05-22 DIAGNOSIS — H401131 Primary open-angle glaucoma, bilateral, mild stage: Secondary | ICD-10-CM | POA: Diagnosis not present

## 2018-05-26 DIAGNOSIS — E785 Hyperlipidemia, unspecified: Secondary | ICD-10-CM | POA: Diagnosis not present

## 2018-08-09 DIAGNOSIS — E785 Hyperlipidemia, unspecified: Secondary | ICD-10-CM | POA: Diagnosis not present

## 2018-08-09 DIAGNOSIS — M109 Gout, unspecified: Secondary | ICD-10-CM | POA: Diagnosis not present

## 2018-08-09 DIAGNOSIS — I10 Essential (primary) hypertension: Secondary | ICD-10-CM | POA: Diagnosis not present

## 2018-08-09 DIAGNOSIS — Z0000 Encounter for general adult medical examination without abnormal findings: Secondary | ICD-10-CM | POA: Diagnosis not present

## 2018-08-09 DIAGNOSIS — Z23 Encounter for immunization: Secondary | ICD-10-CM | POA: Diagnosis not present

## 2018-09-21 DIAGNOSIS — L400 Psoriasis vulgaris: Secondary | ICD-10-CM | POA: Diagnosis not present

## 2018-09-27 DIAGNOSIS — L308 Other specified dermatitis: Secondary | ICD-10-CM | POA: Diagnosis not present

## 2019-02-14 DIAGNOSIS — H401131 Primary open-angle glaucoma, bilateral, mild stage: Secondary | ICD-10-CM | POA: Diagnosis not present

## 2019-05-21 DIAGNOSIS — H401131 Primary open-angle glaucoma, bilateral, mild stage: Secondary | ICD-10-CM | POA: Diagnosis not present

## 2019-05-24 DIAGNOSIS — N400 Enlarged prostate without lower urinary tract symptoms: Secondary | ICD-10-CM | POA: Diagnosis not present

## 2019-05-24 DIAGNOSIS — N202 Calculus of kidney with calculus of ureter: Secondary | ICD-10-CM | POA: Diagnosis not present

## 2019-05-24 DIAGNOSIS — N201 Calculus of ureter: Secondary | ICD-10-CM | POA: Diagnosis not present

## 2019-05-25 ENCOUNTER — Other Ambulatory Visit: Payer: Self-pay | Admitting: Urology

## 2019-05-31 ENCOUNTER — Ambulatory Visit (HOSPITAL_COMMUNITY): Admission: RE | Admit: 2019-05-31 | Payer: BC Managed Care – PPO | Source: Home / Self Care | Admitting: Urology

## 2019-05-31 ENCOUNTER — Encounter (HOSPITAL_COMMUNITY): Admission: RE | Payer: Self-pay | Source: Home / Self Care

## 2019-05-31 SURGERY — LITHOTRIPSY, ESWL
Anesthesia: LOCAL | Laterality: Left

## 2019-07-12 DIAGNOSIS — M109 Gout, unspecified: Secondary | ICD-10-CM | POA: Diagnosis not present

## 2019-07-12 DIAGNOSIS — Z125 Encounter for screening for malignant neoplasm of prostate: Secondary | ICD-10-CM | POA: Diagnosis not present

## 2019-07-12 DIAGNOSIS — I10 Essential (primary) hypertension: Secondary | ICD-10-CM | POA: Diagnosis not present

## 2019-07-12 DIAGNOSIS — E785 Hyperlipidemia, unspecified: Secondary | ICD-10-CM | POA: Diagnosis not present

## 2019-07-12 DIAGNOSIS — Z23 Encounter for immunization: Secondary | ICD-10-CM | POA: Diagnosis not present

## 2019-08-29 DIAGNOSIS — H401131 Primary open-angle glaucoma, bilateral, mild stage: Secondary | ICD-10-CM | POA: Diagnosis not present

## 2020-02-21 DIAGNOSIS — I10 Essential (primary) hypertension: Secondary | ICD-10-CM | POA: Diagnosis not present

## 2020-02-21 DIAGNOSIS — M109 Gout, unspecified: Secondary | ICD-10-CM | POA: Diagnosis not present

## 2020-02-21 DIAGNOSIS — E785 Hyperlipidemia, unspecified: Secondary | ICD-10-CM | POA: Diagnosis not present

## 2020-02-21 DIAGNOSIS — L409 Psoriasis, unspecified: Secondary | ICD-10-CM | POA: Diagnosis not present

## 2020-05-28 DIAGNOSIS — E785 Hyperlipidemia, unspecified: Secondary | ICD-10-CM | POA: Diagnosis not present

## 2020-07-09 DIAGNOSIS — R3912 Poor urinary stream: Secondary | ICD-10-CM | POA: Diagnosis not present

## 2020-07-09 DIAGNOSIS — R350 Frequency of micturition: Secondary | ICD-10-CM | POA: Diagnosis not present

## 2020-07-09 DIAGNOSIS — R351 Nocturia: Secondary | ICD-10-CM | POA: Diagnosis not present

## 2020-07-09 DIAGNOSIS — N401 Enlarged prostate with lower urinary tract symptoms: Secondary | ICD-10-CM | POA: Diagnosis not present

## 2020-07-21 DIAGNOSIS — H401131 Primary open-angle glaucoma, bilateral, mild stage: Secondary | ICD-10-CM | POA: Diagnosis not present

## 2020-08-19 DIAGNOSIS — I10 Essential (primary) hypertension: Secondary | ICD-10-CM | POA: Diagnosis not present

## 2020-08-19 DIAGNOSIS — Z0000 Encounter for general adult medical examination without abnormal findings: Secondary | ICD-10-CM | POA: Diagnosis not present

## 2020-08-19 DIAGNOSIS — R0600 Dyspnea, unspecified: Secondary | ICD-10-CM | POA: Diagnosis not present

## 2020-08-19 DIAGNOSIS — Z13220 Encounter for screening for lipoid disorders: Secondary | ICD-10-CM | POA: Diagnosis not present

## 2020-08-19 DIAGNOSIS — M109 Gout, unspecified: Secondary | ICD-10-CM | POA: Diagnosis not present

## 2020-08-19 DIAGNOSIS — E785 Hyperlipidemia, unspecified: Secondary | ICD-10-CM | POA: Diagnosis not present

## 2020-09-19 DIAGNOSIS — H25013 Cortical age-related cataract, bilateral: Secondary | ICD-10-CM | POA: Diagnosis not present

## 2020-09-19 DIAGNOSIS — H25043 Posterior subcapsular polar age-related cataract, bilateral: Secondary | ICD-10-CM | POA: Diagnosis not present

## 2020-09-19 DIAGNOSIS — H2512 Age-related nuclear cataract, left eye: Secondary | ICD-10-CM | POA: Diagnosis not present

## 2020-09-19 DIAGNOSIS — H2513 Age-related nuclear cataract, bilateral: Secondary | ICD-10-CM | POA: Diagnosis not present

## 2020-09-19 DIAGNOSIS — H401132 Primary open-angle glaucoma, bilateral, moderate stage: Secondary | ICD-10-CM | POA: Diagnosis not present

## 2020-10-20 DIAGNOSIS — M5412 Radiculopathy, cervical region: Secondary | ICD-10-CM | POA: Diagnosis not present

## 2020-10-20 DIAGNOSIS — M25512 Pain in left shoulder: Secondary | ICD-10-CM | POA: Diagnosis not present

## 2020-10-20 DIAGNOSIS — M47812 Spondylosis without myelopathy or radiculopathy, cervical region: Secondary | ICD-10-CM | POA: Diagnosis not present

## 2021-01-01 DIAGNOSIS — M5412 Radiculopathy, cervical region: Secondary | ICD-10-CM | POA: Diagnosis not present

## 2021-01-01 DIAGNOSIS — G5602 Carpal tunnel syndrome, left upper limb: Secondary | ICD-10-CM | POA: Diagnosis not present

## 2021-02-19 DIAGNOSIS — L409 Psoriasis, unspecified: Secondary | ICD-10-CM | POA: Diagnosis not present

## 2021-02-19 DIAGNOSIS — I10 Essential (primary) hypertension: Secondary | ICD-10-CM | POA: Diagnosis not present

## 2021-02-19 DIAGNOSIS — E785 Hyperlipidemia, unspecified: Secondary | ICD-10-CM | POA: Diagnosis not present

## 2021-02-19 DIAGNOSIS — M109 Gout, unspecified: Secondary | ICD-10-CM | POA: Diagnosis not present

## 2021-05-22 DIAGNOSIS — H2513 Age-related nuclear cataract, bilateral: Secondary | ICD-10-CM | POA: Diagnosis not present

## 2021-05-22 DIAGNOSIS — H401132 Primary open-angle glaucoma, bilateral, moderate stage: Secondary | ICD-10-CM | POA: Diagnosis not present

## 2021-05-22 DIAGNOSIS — H2512 Age-related nuclear cataract, left eye: Secondary | ICD-10-CM | POA: Diagnosis not present

## 2021-06-04 DIAGNOSIS — H2512 Age-related nuclear cataract, left eye: Secondary | ICD-10-CM | POA: Diagnosis not present

## 2021-06-04 DIAGNOSIS — H401122 Primary open-angle glaucoma, left eye, moderate stage: Secondary | ICD-10-CM | POA: Diagnosis not present

## 2021-06-05 DIAGNOSIS — H2511 Age-related nuclear cataract, right eye: Secondary | ICD-10-CM | POA: Diagnosis not present

## 2021-06-07 DIAGNOSIS — H21342 Primary cyst of pars plana, left eye: Secondary | ICD-10-CM | POA: Diagnosis not present

## 2021-06-07 DIAGNOSIS — H43812 Vitreous degeneration, left eye: Secondary | ICD-10-CM | POA: Diagnosis not present

## 2021-06-07 DIAGNOSIS — H43392 Other vitreous opacities, left eye: Secondary | ICD-10-CM | POA: Diagnosis not present

## 2021-06-07 DIAGNOSIS — H401114 Primary open-angle glaucoma, right eye, indeterminate stage: Secondary | ICD-10-CM | POA: Diagnosis not present

## 2021-06-07 DIAGNOSIS — H401121 Primary open-angle glaucoma, left eye, mild stage: Secondary | ICD-10-CM | POA: Diagnosis not present

## 2021-08-04 DIAGNOSIS — H2511 Age-related nuclear cataract, right eye: Secondary | ICD-10-CM | POA: Diagnosis not present

## 2021-08-21 DIAGNOSIS — Z125 Encounter for screening for malignant neoplasm of prostate: Secondary | ICD-10-CM | POA: Diagnosis not present

## 2021-08-21 DIAGNOSIS — I10 Essential (primary) hypertension: Secondary | ICD-10-CM | POA: Diagnosis not present

## 2021-08-21 DIAGNOSIS — M109 Gout, unspecified: Secondary | ICD-10-CM | POA: Diagnosis not present

## 2021-08-21 DIAGNOSIS — E785 Hyperlipidemia, unspecified: Secondary | ICD-10-CM | POA: Diagnosis not present

## 2021-08-21 DIAGNOSIS — Z0000 Encounter for general adult medical examination without abnormal findings: Secondary | ICD-10-CM | POA: Diagnosis not present

## 2021-08-21 DIAGNOSIS — Z23 Encounter for immunization: Secondary | ICD-10-CM | POA: Diagnosis not present

## 2021-08-27 DIAGNOSIS — H401111 Primary open-angle glaucoma, right eye, mild stage: Secondary | ICD-10-CM | POA: Diagnosis not present

## 2021-08-27 DIAGNOSIS — H2511 Age-related nuclear cataract, right eye: Secondary | ICD-10-CM | POA: Diagnosis not present

## 2021-08-27 DIAGNOSIS — H401112 Primary open-angle glaucoma, right eye, moderate stage: Secondary | ICD-10-CM | POA: Diagnosis not present

## 2021-09-08 DIAGNOSIS — E875 Hyperkalemia: Secondary | ICD-10-CM | POA: Diagnosis not present

## 2021-09-23 ENCOUNTER — Telehealth: Payer: Self-pay

## 2021-09-28 DIAGNOSIS — H903 Sensorineural hearing loss, bilateral: Secondary | ICD-10-CM | POA: Diagnosis not present

## 2021-09-28 DIAGNOSIS — H9311 Tinnitus, right ear: Secondary | ICD-10-CM | POA: Diagnosis not present

## 2021-10-27 DIAGNOSIS — H903 Sensorineural hearing loss, bilateral: Secondary | ICD-10-CM | POA: Diagnosis not present

## 2021-11-04 DIAGNOSIS — H401131 Primary open-angle glaucoma, bilateral, mild stage: Secondary | ICD-10-CM | POA: Diagnosis not present

## 2021-11-18 DIAGNOSIS — H43392 Other vitreous opacities, left eye: Secondary | ICD-10-CM | POA: Diagnosis not present

## 2021-11-25 DIAGNOSIS — H43812 Vitreous degeneration, left eye: Secondary | ICD-10-CM | POA: Diagnosis not present

## 2022-01-12 DIAGNOSIS — F411 Generalized anxiety disorder: Secondary | ICD-10-CM | POA: Diagnosis not present

## 2022-01-12 DIAGNOSIS — H9311 Tinnitus, right ear: Secondary | ICD-10-CM | POA: Diagnosis not present

## 2022-01-18 DIAGNOSIS — H401131 Primary open-angle glaucoma, bilateral, mild stage: Secondary | ICD-10-CM | POA: Diagnosis not present

## 2022-01-28 DIAGNOSIS — R3912 Poor urinary stream: Secondary | ICD-10-CM | POA: Diagnosis not present

## 2022-01-28 DIAGNOSIS — N401 Enlarged prostate with lower urinary tract symptoms: Secondary | ICD-10-CM | POA: Diagnosis not present

## 2022-01-28 DIAGNOSIS — N50812 Left testicular pain: Secondary | ICD-10-CM | POA: Diagnosis not present

## 2022-01-28 DIAGNOSIS — R3915 Urgency of urination: Secondary | ICD-10-CM | POA: Diagnosis not present

## 2022-03-06 ENCOUNTER — Other Ambulatory Visit: Payer: Self-pay

## 2022-03-06 ENCOUNTER — Encounter (HOSPITAL_COMMUNITY): Payer: Self-pay | Admitting: *Deleted

## 2022-03-06 ENCOUNTER — Emergency Department (HOSPITAL_COMMUNITY)
Admission: EM | Admit: 2022-03-06 | Discharge: 2022-03-06 | Disposition: A | Discharge status: Home / Self Care | Payer: BC Managed Care – PPO | Attending: Emergency Medicine | Admitting: Emergency Medicine

## 2022-03-06 ENCOUNTER — Emergency Department (HOSPITAL_COMMUNITY): Payer: BC Managed Care – PPO

## 2022-03-06 DIAGNOSIS — N390 Urinary tract infection, site not specified: Secondary | ICD-10-CM | POA: Diagnosis not present

## 2022-03-06 DIAGNOSIS — N200 Calculus of kidney: Secondary | ICD-10-CM | POA: Diagnosis not present

## 2022-03-06 DIAGNOSIS — R109 Unspecified abdominal pain: Secondary | ICD-10-CM | POA: Diagnosis not present

## 2022-03-06 DIAGNOSIS — N400 Enlarged prostate without lower urinary tract symptoms: Secondary | ICD-10-CM | POA: Diagnosis not present

## 2022-03-06 DIAGNOSIS — U071 COVID-19: Secondary | ICD-10-CM | POA: Diagnosis not present

## 2022-03-06 DIAGNOSIS — R319 Hematuria, unspecified: Secondary | ICD-10-CM | POA: Diagnosis not present

## 2022-03-06 DIAGNOSIS — I700 Atherosclerosis of aorta: Secondary | ICD-10-CM | POA: Diagnosis not present

## 2022-03-06 DIAGNOSIS — R059 Cough, unspecified: Secondary | ICD-10-CM | POA: Diagnosis not present

## 2022-03-06 DIAGNOSIS — M545 Low back pain, unspecified: Secondary | ICD-10-CM | POA: Diagnosis not present

## 2022-03-06 DIAGNOSIS — N132 Hydronephrosis with renal and ureteral calculous obstruction: Secondary | ICD-10-CM | POA: Diagnosis not present

## 2022-03-06 LAB — CBC WITH DIFFERENTIAL/PLATELET
Abs Immature Granulocytes: 0.02 10*3/uL (ref 0.00–0.07)
Basophils Absolute: 0 10*3/uL (ref 0.0–0.1)
Basophils Relative: 0 %
Eosinophils Absolute: 0 10*3/uL (ref 0.0–0.5)
Eosinophils Relative: 0 %
HCT: 46.3 % (ref 39.0–52.0)
Hemoglobin: 15.9 g/dL (ref 13.0–17.0)
Immature Granulocytes: 0 %
Lymphocytes Relative: 8 %
Lymphs Abs: 1.1 10*3/uL (ref 0.7–4.0)
MCH: 30.9 pg (ref 26.0–34.0)
MCHC: 34.3 g/dL (ref 30.0–36.0)
MCV: 90.1 fL (ref 80.0–100.0)
Monocytes Absolute: 0.8 10*3/uL (ref 0.1–1.0)
Monocytes Relative: 6 %
Neutro Abs: 11.8 10*3/uL — ABNORMAL HIGH (ref 1.7–7.7)
Neutrophils Relative %: 86 %
Platelets: 309 10*3/uL (ref 150–400)
RBC: 5.14 MIL/uL (ref 4.22–5.81)
RDW: 12.3 % (ref 11.5–15.5)
WBC: 13.8 10*3/uL — ABNORMAL HIGH (ref 4.0–10.5)
nRBC: 0 % (ref 0.0–0.2)

## 2022-03-06 LAB — URINALYSIS, ROUTINE W REFLEX MICROSCOPIC
Bacteria, UA: NONE SEEN
Bilirubin Urine: NEGATIVE
Glucose, UA: NEGATIVE mg/dL
Ketones, ur: NEGATIVE mg/dL
Nitrite: NEGATIVE
Protein, ur: 30 mg/dL — AB
RBC / HPF: >50 RBC/hpf — ABNORMAL HIGH (ref 0–5)
Specific Gravity, Urine: 1.016 (ref 1.005–1.030)
pH: 6 (ref 5.0–8.0)

## 2022-03-06 LAB — COMPREHENSIVE METABOLIC PANEL
ALT: 32 U/L (ref 0–44)
AST: 24 U/L (ref 15–41)
Albumin: 4.2 g/dL (ref 3.5–5.0)
Alkaline Phosphatase: 89 U/L (ref 38–126)
Anion gap: 6 (ref 5–15)
BUN: 20 mg/dL (ref 8–23)
CO2: 24 mmol/L (ref 22–32)
Calcium: 8.7 mg/dL — ABNORMAL LOW (ref 8.9–10.3)
Chloride: 110 mmol/L (ref 98–111)
Creatinine, Ser: 1.23 mg/dL (ref 0.61–1.24)
GFR, Estimated: >60 mL/min (ref >60)
Glucose, Bld: 147 mg/dL — ABNORMAL HIGH (ref 70–99)
Potassium: 4 mmol/L (ref 3.5–5.1)
Sodium: 140 mmol/L (ref 135–145)
Total Bilirubin: 1 mg/dL (ref 0.3–1.2)
Total Protein: 6.9 g/dL (ref 6.5–8.1)

## 2022-03-06 LAB — LIPASE, BLOOD: Lipase: 29 U/L (ref 11–51)

## 2022-03-06 MED ORDER — OXYCODONE-ACETAMINOPHEN 5-325 MG PO TABS: 1.0000 | ORAL_TABLET | ORAL | 0 refills | Status: AC | PRN

## 2022-03-06 MED ORDER — ONDANSETRON HCL 4 MG PO TABS: 4.0000 mg | ORAL_TABLET | Freq: Three times a day (TID) | ORAL | 0 refills | Status: AC | PRN

## 2022-03-06 MED ORDER — ONDANSETRON HCL 4 MG/2ML IJ SOLN
4.0000 mg | Freq: Once | INTRAMUSCULAR | Status: AC
  Administered 2022-03-06: 4 mg via INTRAVENOUS
  Filled 2022-03-06: qty 2

## 2022-03-06 MED ORDER — HYDROMORPHONE HCL 1 MG/ML IJ SOLN
1.0000 mg | Freq: Once | INTRAMUSCULAR | Status: AC
  Administered 2022-03-06: 1 mg via INTRAVENOUS
  Filled 2022-03-06: qty 1

## 2022-03-06 MED ORDER — SODIUM CHLORIDE 0.9 % IV BOLUS
500.0000 mL | Freq: Once | INTRAVENOUS | Status: AC
  Administered 2022-03-06: 500 mL via INTRAVENOUS

## 2022-03-06 MED ORDER — TAMSULOSIN HCL 0.4 MG PO CAPS: 0.4000 mg | ORAL_CAPSULE | Freq: Every day | ORAL | 0 refills | Status: AC | PRN

## 2022-03-06 NOTE — ED Provider Notes (Signed)
[facility]-EMERGENCY DEPT Provider Note   CSN: [id number] Arrival date & time: 03/06/22  1354     History  Chief Complaint  Patient presents with   Flank Pain   Covid Positive    Thomas Stafford is a 65 y.o. male.  The history is provided by the patient and medical records. No language interpreter was used.  Flank Pain This is a new problem. The current episode started 12 to 24 hours ago. The problem occurs constantly. The problem has not changed since onset.Pertinent negatives include no chest pain, no abdominal pain, no headaches and no shortness of breath. Nothing aggravates the symptoms. Nothing relieves the symptoms. He has tried nothing for the symptoms. The treatment provided no relief.      Home Medications Prior to Admission medications   Medication Sig Start Date End Date Taking? Authorizing Provider  ciprofloxacin (CIPRO) 500 MG tablet Take 500 mg by mouth 2 (two) times daily.    [provider]  HYDROcodone-acetaminophen (NORCO/VICODIN) 5-325 MG per tablet Take 1-2 tablets by mouth every 4 (four) hours as needed. For pain    [provider]  losartan (COZAAR) 50 MG tablet Take 100 mg by mouth daily.    [provider]  rosuvastatin (CRESTOR) 5 MG tablet Take 10 mg by mouth daily.    [provider]  tamsulosin (FLOMAX) 0.4 MG CAPS capsule Take 0.4 mg by mouth.    [provider]      Allergies    Patient has no known allergies.    Review of Systems   Review of Systems  Constitutional:  Negative for chills, fatigue and fever.  HENT:  Negative for congestion.   Respiratory:  Positive for cough. Negative for chest tightness, shortness of breath and wheezing.   Cardiovascular:  Negative for chest pain, palpitations and leg swelling.  Gastrointestinal:  Positive for nausea. Negative for abdominal pain, constipation, diarrhea and vomiting.  Genitourinary:  Positive for flank pain. Negative for dysuria and  frequency.  Musculoskeletal:  Positive for back pain. Negative for neck pain and neck stiffness.  Skin:  Negative for rash and wound.  Neurological:  Negative for light-headedness and headaches.  Psychiatric/Behavioral:  Negative for agitation and confusion.   All other systems reviewed and are negative.  Physical Exam Updated Vital Signs BP 138/90 (BP Location: Right Arm)   Pulse 60   Temp 97.6 F (36.4 C) (Oral)   Resp 18   Ht 6\' 2"  (1.88 m)   Wt 93 kg   SpO2 98%   BMI 26.32 kg/m  Physical Exam Vitals and nursing note reviewed.  Constitutional:      General: He is not in acute distress.    Appearance: He is well-developed. He is not ill-appearing, toxic-appearing or diaphoretic.  HENT:     Head: Normocephalic and atraumatic.     Mouth/Throat:     Mouth: Mucous membranes are dry.  Eyes:     Conjunctiva/sclera: Conjunctivae normal.  Cardiovascular:     Rate and Rhythm: Normal rate and regular rhythm.     Heart sounds: No murmur heard. Pulmonary:     Effort: Pulmonary effort is normal. No respiratory distress.     Breath sounds: Normal breath sounds. No wheezing, rhonchi or rales.  Chest:     Chest wall: No tenderness.  Abdominal:     General: Abdomen is flat. There is no distension.     Palpations: Abdomen is soft.     Tenderness: There is  no abdominal tenderness. There is right CVA tenderness. There is no left CVA tenderness, guarding or rebound.  Musculoskeletal:        General: Tenderness present. No swelling.     Cervical back: Neck supple. No tenderness.     Right lower leg: No edema.     Left lower leg: No edema.  Skin:    General: Skin is warm and dry.     Capillary Refill: Capillary refill takes less than 2 seconds.     Coloration: Skin is not pale.     Findings: No erythema or rash.  Neurological:     Mental Status: He is alert.  Psychiatric:        Mood and Affect: Mood normal.    ED Results / Procedures / Treatments   Labs (all labs ordered are  listed, but only abnormal results are displayed) Labs Reviewed  CBC WITH DIFFERENTIAL/PLATELET - Abnormal; Notable for the following components:      Result Value   WBC 13.8 (*)    Neutro Abs 11.8 (*)    All other components within normal limits  COMPREHENSIVE METABOLIC PANEL - Abnormal; Notable for the following components:   Glucose, Bld 147 (*)    Calcium 8.7 (*)    All other components within normal limits  URINALYSIS, ROUTINE W REFLEX MICROSCOPIC - Abnormal; Notable for the following components:   Hgb urine dipstick LARGE (*)    Protein, ur 30 (*)    Leukocytes,Ua MODERATE (*)    RBC / HPF >50 (*)    All other components within normal limits  URINE CULTURE  LIPASE, BLOOD    EKG None  Radiology CT Renal Stone Study  Result Date: 03/06/2022 CLINICAL DATA:  Right flank pain described-similar to a previous kidney stone. Cough for 1 week and COVID-19 positive. EXAM: CT ABDOMEN AND PELVIS WITHOUT CONTRAST TECHNIQUE: Multidetector CT imaging of the abdomen and pelvis was performed following the standard protocol without IV contrast. RADIATION DOSE REDUCTION: This exam was performed according to the departmental dose-optimization program which includes automated exposure control, adjustment of the mA and/or kV according to patient size and/or use of iterative reconstruction technique. COMPARISON:  06/10/2019. FINDINGS: Lower chest: Clear lung bases. Hepatobiliary: No focal liver abnormality is seen. No gallstones, gallbladder wall thickening, or biliary dilatation. Pancreas: Unremarkable. No pancreatic ductal dilatation or surrounding inflammatory changes. Spleen: Normal in size without focal abnormality. Adrenals/Urinary Tract: No adrenal masses. Mild right hydroureteronephrosis with perinephric and Peri ureteral stranding. This is due to a 6 mm stone in the distal ureter. No other ureteral stones. No left hydronephrosis. Normal left ureter. 1.2 cm low attenuation mass protrudes from the  posterior left kidney at the midpole, stable, consistent with a cyst. No other renal masses. Bilateral nonobstructing intrarenal stones. Bladder is unremarkable. Stomach/Bowel: Stomach is within normal limits. Appendix appears normal. No evidence of bowel wall thickening, distention, or inflammatory changes. Vascular/Lymphatic: Mild aortic atherosclerosis. No aneurysm. No enlarged lymph nodes. Reproductive: Mild prostate enlargement, stable from prior CT. Other: No abdominal wall hernia or abnormality. No abdominopelvic ascites. Musculoskeletal: No fracture or acute finding.  No bone lesion. IMPRESSION: 1. 6 mm stone in the distal right ureter causes mild right hydroureteronephrosis. 2. No other acute abnormality within the abdomen or pelvis. 3. Bilateral nonobstructing intrarenal stones. 4. Mild aortic atherosclerosis. Electronically Signed   By: Lajean Manes M.D.   On: 03/06/2022 15:03    Procedures Procedures    Medications Ordered in ED Medications  HYDROmorphone (DILAUDID)  injection 1 mg (1 mg Intravenous Given 03/06/22 1507)  ondansetron (ZOFRAN) injection 4 mg (4 mg Intravenous Given 03/06/22 1507)  sodium chloride 0.9 % bolus 500 mL (0 mLs Intravenous Stopped 03/06/22 1646)    ED Course/ Medical Decision Making/ A&P                           Medical Decision Making Amount and/or Complexity of Data Reviewed Labs: ordered. Radiology: ordered.  Risk Prescription drug management.    Thomas Stafford is a 66 y.o. male with a past medical history significant for kidney stones requiring stent and lithotripsy who presents with 1 week of dry cough and 1 day of right flank pain.  According to patient, he just got back from a trip to [facility] 1 week ago and has had a dry cough ever since.  He denies any fevers, chills, chest pain, shortness of breath.  He reports he would not have come in for the cough but today started having some right flank pain that feels similar to when he had  kidney stones.  He also reports some nausea.  He reports he is feeling dehydrated and dry mouth but otherwise is denying constipation, diarrhea, or urinary changes.  He denies any trauma.  Denies any constipation, diarrhea, or other complaints.  He reports his cough has been dry.  He took a COVID test this morning and it was positive however he is more concerned about the flank pain.  On exam, lungs were clear.  Chest was nontender.  Abdomen was nontender.  Patient has tenderness in his right flank and right CVA area.  He was not tachycardic or tachypneic and was not hypoxic.  No fever.  He denies any penile or groin pain and deferred an exam there.  Clinically I suspect patient is recurrent kidney stone that may have been prompted by dehydration from him having some nausea and fighting his current COVID infection.  I do feel need to get CT imaging to rule out large stone needing intervention given his history of the same.  We will get urinalysis and labs.  As he denies any chest pain, shortness of breath, fevers, and all has a dry cough that is mild, we agreed to hold on a dedicated chest x-ray but we will see part of his lungs on the CT of the abdomen and pelvis.  We will get him some pain medicine, nausea medicine, due to the dry mouth given the small amount of fluids.  Anticipate reassessment after imaging.  CT scan showed a 6 mm stone in the right side causing his symptoms.  Mild hydronephrosis and it is distal.  Urinalysis shows no nitrites and no bacteria, doubt UTI or infected stone at this time.  Patient is feeling much better after medications.  His pain is gone and I suspect he may have already passed it.  CT scan was able to look at the lungs partially and there is no evidence of pneumonia.  Patient's vital signs are reassuring.  Patient agrees for discharge home for outpatient medical expulsion therapy for his kidney stone and to treat his COVID at home.  He understands return precautions and  follow-up instructions and was given pain medicine, nausea medicine, and Flomax and urology follow-up.  He agrees and understands return precautions.  Patient discharged in good condition.         Final Clinical Impression(s) / ED Diagnoses Final diagnoses:  Kidney stone  Right flank pain  COVID    Rx / DC Orders ED Discharge Orders          Ordered    oxyCODONE-acetaminophen (PERCOCET/ROXICET) 5-325 MG tablet  Every 4 hours PRN        03/06/22 1734    ondansetron (ZOFRAN) 4 MG tablet  Every 8 hours PRN        03/06/22 1734    tamsulosin (FLOMAX) 0.4 MG CAPS capsule  Daily PRN        03/06/22 1734            Clinical Impression: 1. Kidney stone   2. Right flank pain   3. COVID     Disposition: Discharge  Condition: Good  I have discussed the results, Dx and Tx plan with the pt(& family if present). He/she/they expressed understanding and agree(s) with the plan. Discharge instructions discussed at great length. Strict return precautions discussed and pt &/or family have verbalized understanding of the instructions. No further questions at time of discharge.    New Prescriptions   ONDANSETRON (ZOFRAN) 4 MG TABLET    Take 1 tablet (4 mg total) by mouth every 8 (eight) hours as needed for nausea or vomiting.   OXYCODONE-ACETAMINOPHEN (PERCOCET/ROXICET) 5-325 MG TABLET    Take 1 tablet by mouth every 4 (four) hours as needed for severe pain.   TAMSULOSIN (FLOMAX) 0.4 MG CAPS CAPSULE    Take 1 capsule (0.4 mg total) by mouth daily as needed.    Follow Up: [facility] DEPT [place] [id number] mc [id number] [place]        Marinell Igarashi, Gwenyth Allegra, MD 03/06/22 1740

## 2022-03-06 NOTE — ED Triage Notes (Signed)
Rt flank pain, feels like when he usually has a kidney stone, pt also tested + Covid this morning.

## 2022-03-06 NOTE — Discharge Instructions (Signed)
Your history, exam, work-up today confirmed a right-sided kidney stone as a cause of your symptoms.  It looks like it was getting ready to fall to the bladder and your urine did not show convincing evidence of infection.  A culture was sent however and if it turns positive they will call you.  Please rest and stay hydrated and use medications to help pass the stone.  Please follow-up with urology.  Your description of your cough also was consistent with your positive COVID test.  The CT scan did not show evidence of acute pneumonia and your vital signs are reassuring while you are being monitored.  Please rest and stay hydrated at home with the [place].  If any symptoms change or worsen acutely, please return to the nearest emergency department

## 2022-03-08 LAB — URINE CULTURE: Culture: NO GROWTH

## 2022-04-28 DIAGNOSIS — H401131 Primary open-angle glaucoma, bilateral, mild stage: Secondary | ICD-10-CM | POA: Diagnosis not present

## 2022-05-17 DIAGNOSIS — H18413 Arcus senilis, bilateral: Secondary | ICD-10-CM | POA: Diagnosis not present

## 2022-05-17 DIAGNOSIS — H43392 Other vitreous opacities, left eye: Secondary | ICD-10-CM | POA: Diagnosis not present

## 2022-05-17 DIAGNOSIS — H02831 Dermatochalasis of right upper eyelid: Secondary | ICD-10-CM | POA: Diagnosis not present

## 2022-05-17 DIAGNOSIS — H401132 Primary open-angle glaucoma, bilateral, moderate stage: Secondary | ICD-10-CM | POA: Diagnosis not present

## 2022-05-24 DIAGNOSIS — Z961 Presence of intraocular lens: Secondary | ICD-10-CM | POA: Diagnosis not present

## 2022-06-03 DIAGNOSIS — H9311 Tinnitus, right ear: Secondary | ICD-10-CM | POA: Diagnosis not present

## 2022-06-03 DIAGNOSIS — Z23 Encounter for immunization: Secondary | ICD-10-CM | POA: Diagnosis not present

## 2022-06-03 DIAGNOSIS — F411 Generalized anxiety disorder: Secondary | ICD-10-CM | POA: Diagnosis not present

## 2022-06-03 DIAGNOSIS — E785 Hyperlipidemia, unspecified: Secondary | ICD-10-CM | POA: Diagnosis not present

## 2022-06-03 DIAGNOSIS — I10 Essential (primary) hypertension: Secondary | ICD-10-CM | POA: Diagnosis not present

## 2022-07-21 DIAGNOSIS — H401131 Primary open-angle glaucoma, bilateral, mild stage: Secondary | ICD-10-CM | POA: Diagnosis not present

## 2022-09-09 DIAGNOSIS — E785 Hyperlipidemia, unspecified: Secondary | ICD-10-CM | POA: Diagnosis not present

## 2022-09-09 DIAGNOSIS — Z0000 Encounter for general adult medical examination without abnormal findings: Secondary | ICD-10-CM | POA: Diagnosis not present

## 2022-09-09 DIAGNOSIS — Z125 Encounter for screening for malignant neoplasm of prostate: Secondary | ICD-10-CM | POA: Diagnosis not present

## 2022-09-09 DIAGNOSIS — I10 Essential (primary) hypertension: Secondary | ICD-10-CM | POA: Diagnosis not present

## 2022-09-09 DIAGNOSIS — M109 Gout, unspecified: Secondary | ICD-10-CM | POA: Diagnosis not present

## 2022-09-09 DIAGNOSIS — L409 Psoriasis, unspecified: Secondary | ICD-10-CM | POA: Diagnosis not present

## 2022-09-28 DIAGNOSIS — M5412 Radiculopathy, cervical region: Secondary | ICD-10-CM | POA: Diagnosis not present

## 2022-10-21 DIAGNOSIS — H401131 Primary open-angle glaucoma, bilateral, mild stage: Secondary | ICD-10-CM | POA: Diagnosis not present

## 2022-11-03 DIAGNOSIS — M4802 Spinal stenosis, cervical region: Secondary | ICD-10-CM | POA: Diagnosis not present

## 2022-11-03 DIAGNOSIS — M542 Cervicalgia: Secondary | ICD-10-CM | POA: Diagnosis not present

## 2022-11-03 DIAGNOSIS — M5010 Cervical disc disorder with radiculopathy, unspecified cervical region: Secondary | ICD-10-CM | POA: Diagnosis not present

## 2022-11-03 DIAGNOSIS — M5412 Radiculopathy, cervical region: Secondary | ICD-10-CM | POA: Diagnosis not present

## 2022-11-15 DIAGNOSIS — M5412 Radiculopathy, cervical region: Secondary | ICD-10-CM | POA: Diagnosis not present

## 2022-11-15 DIAGNOSIS — M4802 Spinal stenosis, cervical region: Secondary | ICD-10-CM | POA: Diagnosis not present

## 2022-11-29 DIAGNOSIS — M542 Cervicalgia: Secondary | ICD-10-CM | POA: Diagnosis not present

## 2022-11-29 DIAGNOSIS — M791 Myalgia, unspecified site: Secondary | ICD-10-CM | POA: Diagnosis not present

## 2022-12-10 NOTE — Telephone Encounter (Signed)
error 

## 2022-12-27 DIAGNOSIS — D122 Benign neoplasm of ascending colon: Secondary | ICD-10-CM | POA: Diagnosis not present

## 2022-12-27 DIAGNOSIS — K5730 Diverticulosis of large intestine without perforation or abscess without bleeding: Secondary | ICD-10-CM | POA: Diagnosis not present

## 2022-12-27 DIAGNOSIS — Z09 Encounter for follow-up examination after completed treatment for conditions other than malignant neoplasm: Secondary | ICD-10-CM | POA: Diagnosis not present

## 2022-12-27 DIAGNOSIS — Z1211 Encounter for screening for malignant neoplasm of colon: Secondary | ICD-10-CM | POA: Diagnosis not present

## 2022-12-27 DIAGNOSIS — Z86010 Personal history of colonic polyps: Secondary | ICD-10-CM | POA: Diagnosis not present

## 2023-02-10 IMAGING — CT CT RENAL STONE PROTOCOL
2 of 4 series · 15 of 46 positions shown, 17 images · non-contrast
Comparison: 06/10/2019.

CLINICAL DATA: Right flank pain described-similar to a previous
kidney stone. Cough for 1 week and [id number] positive.



[Series 2: axial st · axial · 0.93mm/px · z∈[-437,+38]mm · 12 of 109 slices shown, 14 images]
[im 7/109  soft-tissue]
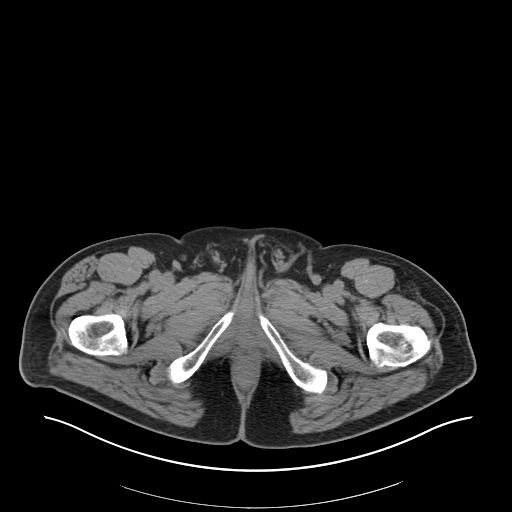
[im 7/109  bone]
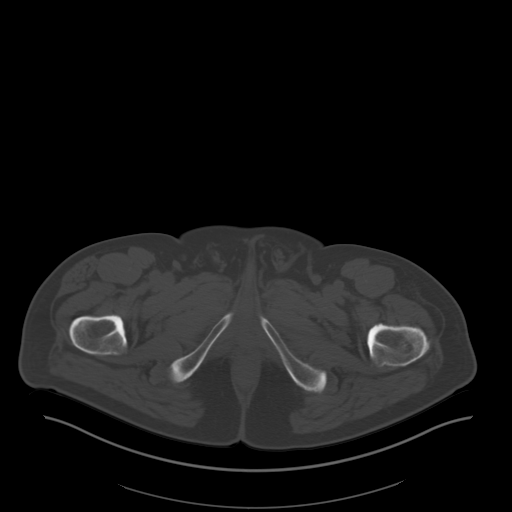
[im 14/109  soft-tissue]
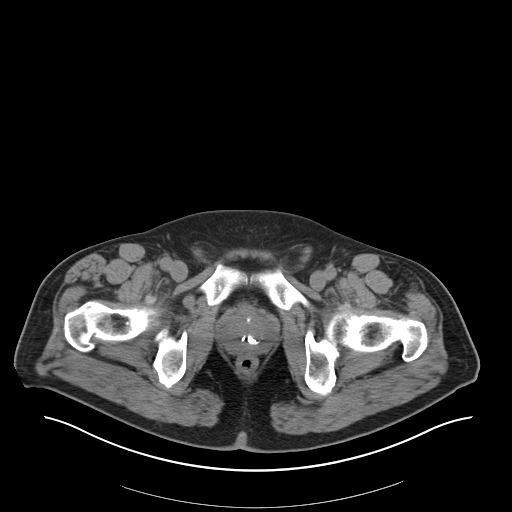
[im 28/109  soft-tissue]
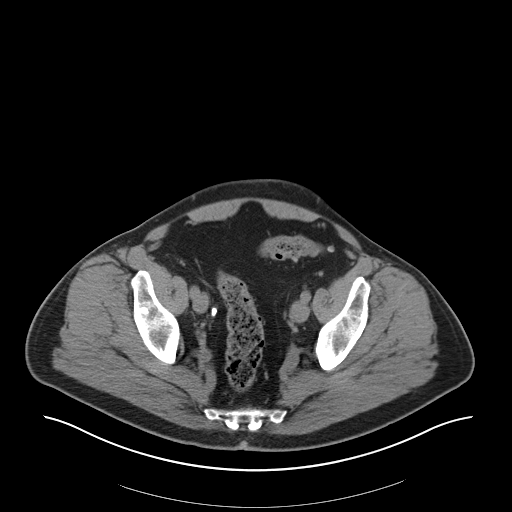
[im 34/109  soft-tissue]
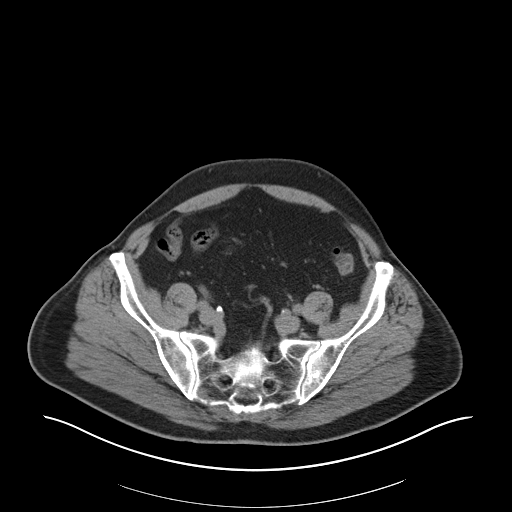
[im 41/109  soft-tissue]
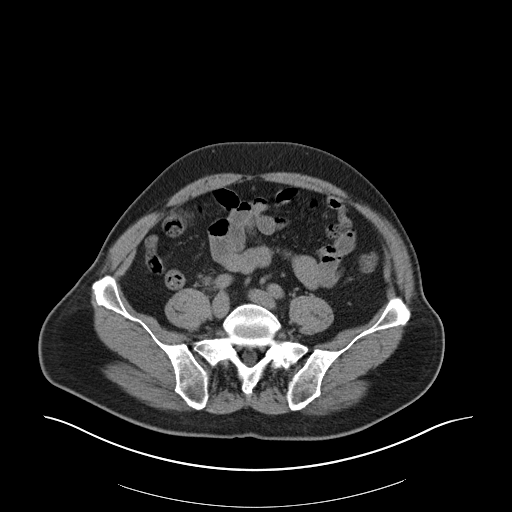
[im 48/109  soft-tissue]
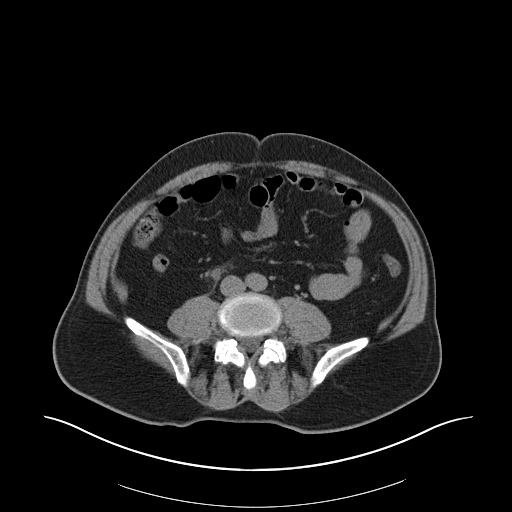
[im 61/109  soft-tissue]
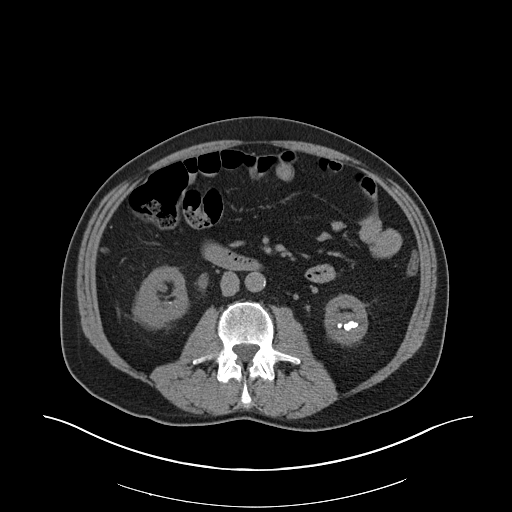
[im 68/109  soft-tissue]
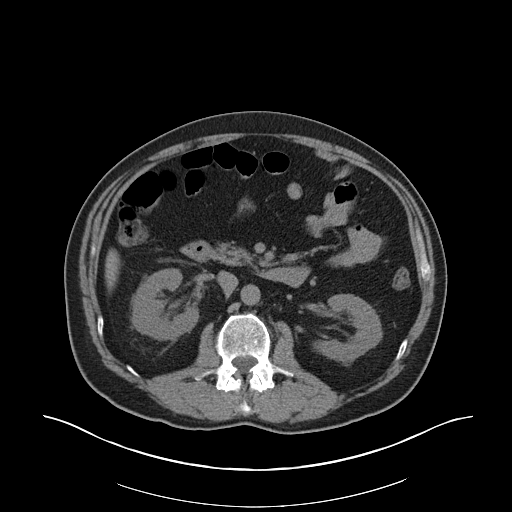
[im 75/109  soft-tissue]
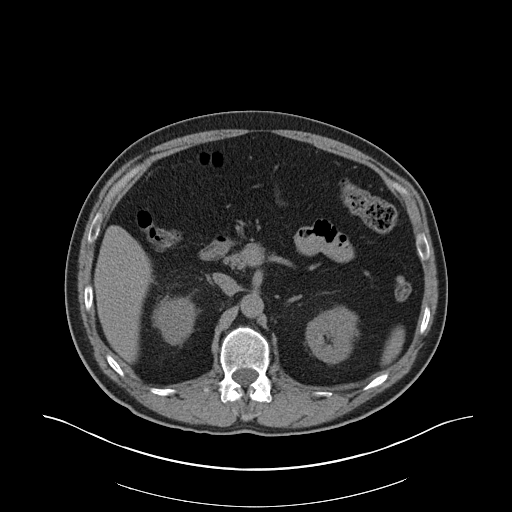
[im 75/109  bone]
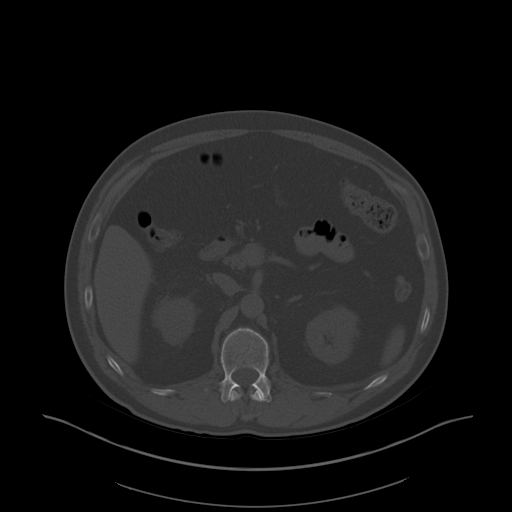
[im 82/109  soft-tissue]
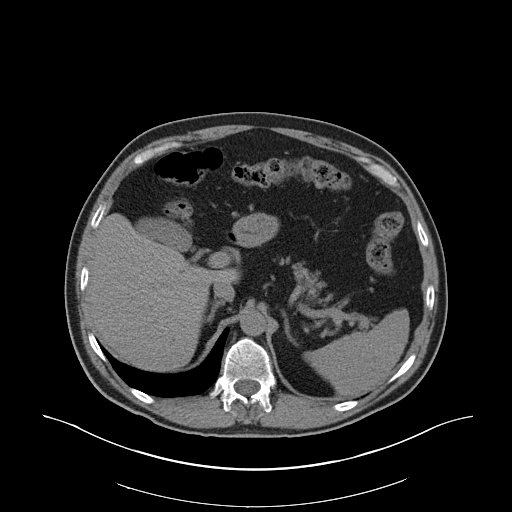
[im 95/109  soft-tissue]
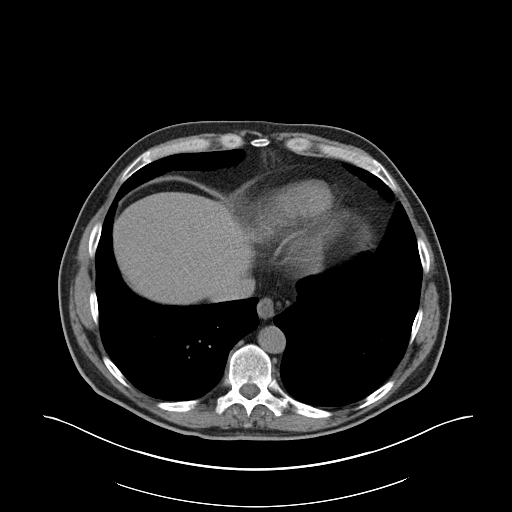
[im 102/109  soft-tissue]
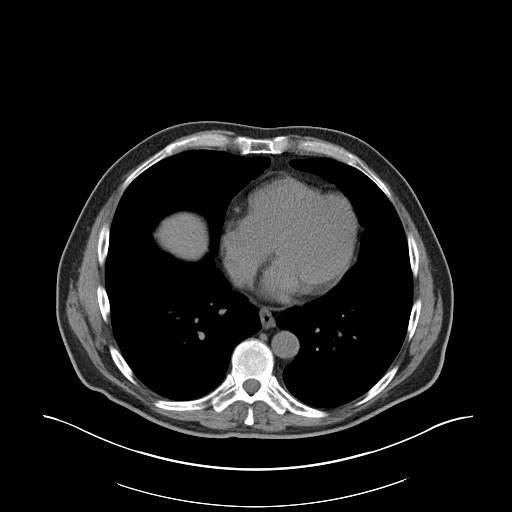

[Series 4: coronal · coronal · 0.79mm/px · 3 of 160 slices shown]
[im 54/160  soft-tissue]
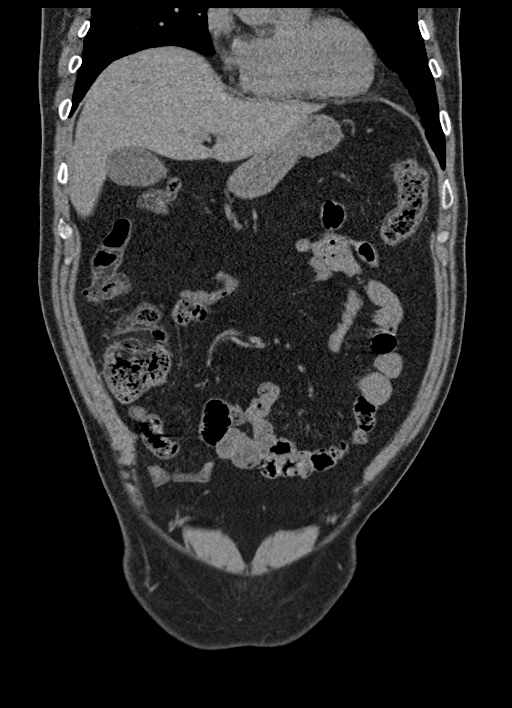
[im 71/160  soft-tissue]
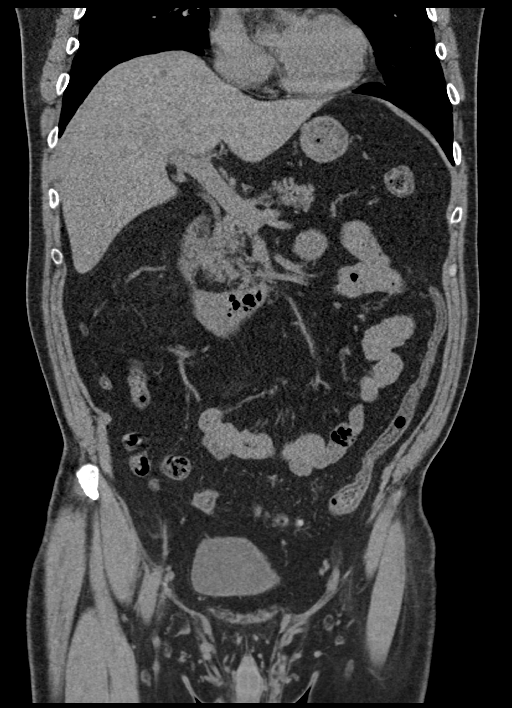
[im 89/160  soft-tissue]
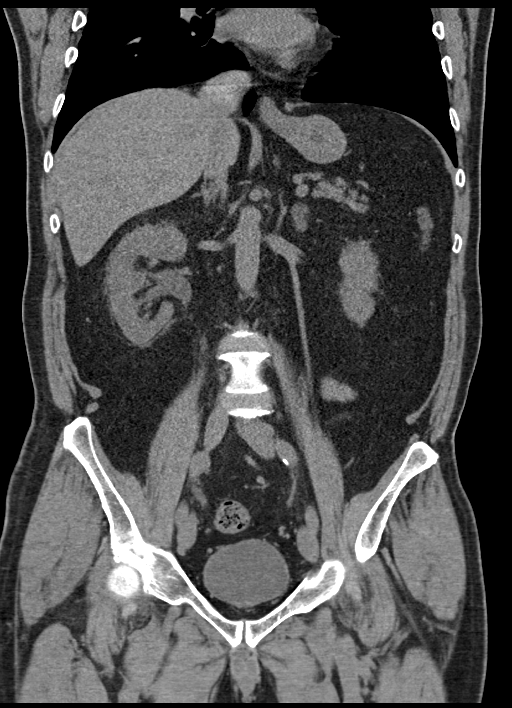

[15 of 46 positions shown; findings below may reference images not displayed]

FINDINGS: Lower chest: Clear lung bases.

Hepatobiliary: No focal liver abnormality is seen. No gallstones,
gallbladder wall thickening, or biliary dilatation.

Pancreas: Unremarkable. No pancreatic ductal dilatation or
surrounding inflammatory changes.

Spleen: Normal in size without focal abnormality.

Adrenals/Urinary Tract: No adrenal masses.

Mild right hydroureteronephrosis with perinephric and Peri ureteral
stranding. This is due to a 6 mm stone in the distal ureter. No
other ureteral stones. No left hydronephrosis. Normal left ureter.

1.2 cm low attenuation mass protrudes from the posterior left kidney
at the midpole, stable, consistent with a cyst. No other renal
masses. Bilateral nonobstructing intrarenal stones.

Bladder is unremarkable.

Stomach/Bowel: Stomach is within normal limits. Appendix appears
normal. No evidence of bowel wall thickening, distention, or
inflammatory changes.

Vascular/Lymphatic: Mild aortic atherosclerosis. No aneurysm. No
enlarged lymph nodes.

Reproductive: Mild prostate enlargement, stable from prior CT.

Other: No abdominal wall hernia or abnormality. No abdominopelvic
ascites.

Musculoskeletal: No fracture or acute finding.  No bone lesion.
IMPRESSION: 1. 6 mm stone in the distal right ureter causes mild right
hydroureteronephrosis.
2. No other acute abnormality within the abdomen or pelvis.
3. Bilateral nonobstructing intrarenal stones.
4. Mild aortic atherosclerosis.

## 2023-03-10 DIAGNOSIS — H9311 Tinnitus, right ear: Secondary | ICD-10-CM | POA: Diagnosis not present

## 2023-03-10 DIAGNOSIS — E785 Hyperlipidemia, unspecified: Secondary | ICD-10-CM | POA: Diagnosis not present

## 2023-03-10 DIAGNOSIS — I10 Essential (primary) hypertension: Secondary | ICD-10-CM | POA: Diagnosis not present

## 2023-03-10 DIAGNOSIS — F411 Generalized anxiety disorder: Secondary | ICD-10-CM | POA: Diagnosis not present

## 2023-03-30 DIAGNOSIS — H401131 Primary open-angle glaucoma, bilateral, mild stage: Secondary | ICD-10-CM | POA: Diagnosis not present

## 2023-04-11 DIAGNOSIS — H903 Sensorineural hearing loss, bilateral: Secondary | ICD-10-CM | POA: Diagnosis not present

## 2023-04-11 DIAGNOSIS — H9311 Tinnitus, right ear: Secondary | ICD-10-CM | POA: Diagnosis not present

## 2023-07-26 DIAGNOSIS — H401131 Primary open-angle glaucoma, bilateral, mild stage: Secondary | ICD-10-CM | POA: Diagnosis not present

## 2023-09-21 DIAGNOSIS — E785 Hyperlipidemia, unspecified: Secondary | ICD-10-CM | POA: Diagnosis not present

## 2023-09-21 DIAGNOSIS — L409 Psoriasis, unspecified: Secondary | ICD-10-CM | POA: Diagnosis not present

## 2023-09-21 DIAGNOSIS — I10 Essential (primary) hypertension: Secondary | ICD-10-CM | POA: Diagnosis not present

## 2023-09-21 DIAGNOSIS — M109 Gout, unspecified: Secondary | ICD-10-CM | POA: Diagnosis not present

## 2023-09-21 DIAGNOSIS — Z23 Encounter for immunization: Secondary | ICD-10-CM | POA: Diagnosis not present

## 2023-09-21 DIAGNOSIS — Z0000 Encounter for general adult medical examination without abnormal findings: Secondary | ICD-10-CM | POA: Diagnosis not present

## 2023-09-21 DIAGNOSIS — Z125 Encounter for screening for malignant neoplasm of prostate: Secondary | ICD-10-CM | POA: Diagnosis not present

## 2023-11-09 DIAGNOSIS — M722 Plantar fascial fibromatosis: Secondary | ICD-10-CM | POA: Diagnosis not present

## 2023-11-09 DIAGNOSIS — M6702 Short Achilles tendon (acquired), left ankle: Secondary | ICD-10-CM | POA: Diagnosis not present

## 2023-11-09 DIAGNOSIS — M6701 Short Achilles tendon (acquired), right ankle: Secondary | ICD-10-CM | POA: Diagnosis not present

## 2023-11-24 DIAGNOSIS — H401131 Primary open-angle glaucoma, bilateral, mild stage: Secondary | ICD-10-CM | POA: Diagnosis not present

## 2024-03-21 DIAGNOSIS — M109 Gout, unspecified: Secondary | ICD-10-CM | POA: Diagnosis not present

## 2024-03-21 DIAGNOSIS — F419 Anxiety disorder, unspecified: Secondary | ICD-10-CM | POA: Diagnosis not present

## 2024-03-21 DIAGNOSIS — E785 Hyperlipidemia, unspecified: Secondary | ICD-10-CM | POA: Diagnosis not present

## 2024-03-21 DIAGNOSIS — R195 Other fecal abnormalities: Secondary | ICD-10-CM | POA: Diagnosis not present

## 2024-03-21 DIAGNOSIS — I10 Essential (primary) hypertension: Secondary | ICD-10-CM | POA: Diagnosis not present

## 2024-03-28 DIAGNOSIS — H401131 Primary open-angle glaucoma, bilateral, mild stage: Secondary | ICD-10-CM | POA: Diagnosis not present

## 2024-07-25 DIAGNOSIS — H401131 Primary open-angle glaucoma, bilateral, mild stage: Secondary | ICD-10-CM | POA: Diagnosis not present

## 2024-09-25 ENCOUNTER — Other Ambulatory Visit (HOSPITAL_BASED_OUTPATIENT_CLINIC_OR_DEPARTMENT_OTHER): Payer: Self-pay | Admitting: Family Medicine

## 2024-09-25 DIAGNOSIS — E785 Hyperlipidemia, unspecified: Secondary | ICD-10-CM | POA: Diagnosis not present

## 2024-09-25 DIAGNOSIS — N400 Enlarged prostate without lower urinary tract symptoms: Secondary | ICD-10-CM | POA: Diagnosis not present

## 2024-09-25 DIAGNOSIS — F411 Generalized anxiety disorder: Secondary | ICD-10-CM | POA: Diagnosis not present

## 2024-09-25 DIAGNOSIS — Z9189 Other specified personal risk factors, not elsewhere classified: Secondary | ICD-10-CM

## 2024-09-25 DIAGNOSIS — I10 Essential (primary) hypertension: Secondary | ICD-10-CM | POA: Diagnosis not present

## 2024-09-25 DIAGNOSIS — Z0000 Encounter for general adult medical examination without abnormal findings: Secondary | ICD-10-CM | POA: Diagnosis not present

## 2024-09-25 DIAGNOSIS — N529 Male erectile dysfunction, unspecified: Secondary | ICD-10-CM | POA: Diagnosis not present

## 2024-09-28 ENCOUNTER — Ambulatory Visit (HOSPITAL_BASED_OUTPATIENT_CLINIC_OR_DEPARTMENT_OTHER)
Admission: RE | Admit: 2024-09-28 | Discharge: 2024-09-28 | Disposition: A | Discharge status: Home / Self Care | Payer: Self-pay | Source: Ambulatory Visit | Attending: Family Medicine | Admitting: Family Medicine

## 2024-09-28 DIAGNOSIS — Z9189 Other specified personal risk factors, not elsewhere classified: Secondary | ICD-10-CM | POA: Insufficient documentation
